# Patient Record
Sex: Male | Born: 1965 | Race: White | Hispanic: No | Smoking: Former smoker
Health system: Southern US, Community
[De-identification: ages and names within clinical notes are randomized; demographics above are authoritative.]

## PROBLEM LIST (undated history)

## (undated) DIAGNOSIS — I517 Cardiomegaly: Secondary | ICD-10-CM

## (undated) DIAGNOSIS — I10 Essential (primary) hypertension: Secondary | ICD-10-CM

## (undated) DIAGNOSIS — N289 Disorder of kidney and ureter, unspecified: Secondary | ICD-10-CM

## (undated) HISTORY — PX: NECK SURGERY: SHX720

## (undated) HISTORY — PX: DG SHOULDER ARTHROGRAM RIGHT: HXRAD1608

## (undated) HISTORY — PX: KNEE SURGERY: SHX244

## (undated) HISTORY — DX: Cardiomegaly: I51.7

## (undated) HISTORY — PX: HERNIA REPAIR: SHX51

---

## 1994-04-11 HISTORY — PX: OTHER SURGICAL HISTORY: SHX169

## 1997-12-23 ENCOUNTER — Inpatient Hospital Stay (HOSPITAL_COMMUNITY): Admission: EM | Admit: 1997-12-23 | Discharge: 1997-12-29 | Payer: Self-pay | Admitting: Emergency Medicine

## 1997-12-27 ENCOUNTER — Encounter: Payer: Self-pay | Admitting: General Surgery

## 2001-06-03 ENCOUNTER — Emergency Department (HOSPITAL_COMMUNITY): Admission: EM | Admit: 2001-06-03 | Discharge: 2001-06-04 | Payer: Self-pay | Admitting: *Deleted

## 2001-06-05 ENCOUNTER — Emergency Department (HOSPITAL_COMMUNITY): Admission: EM | Admit: 2001-06-05 | Discharge: 2001-06-05 | Payer: Self-pay

## 2002-03-22 ENCOUNTER — Ambulatory Visit (HOSPITAL_BASED_OUTPATIENT_CLINIC_OR_DEPARTMENT_OTHER): Admission: RE | Admit: 2002-03-22 | Discharge: 2002-03-22 | Payer: Self-pay | Admitting: General Surgery

## 2002-07-04 ENCOUNTER — Encounter: Admission: RE | Admit: 2002-07-04 | Discharge: 2002-07-17 | Payer: Self-pay | Admitting: General Surgery

## 2005-08-10 ENCOUNTER — Encounter: Payer: Self-pay | Admitting: Emergency Medicine

## 2005-08-10 ENCOUNTER — Encounter: Payer: Self-pay | Admitting: General Surgery

## 2015-09-29 ENCOUNTER — Ambulatory Visit: Payer: Self-pay | Admitting: Physician Assistant

## 2015-09-29 ENCOUNTER — Encounter (HOSPITAL_COMMUNITY): Payer: Self-pay | Admitting: Emergency Medicine

## 2015-09-29 ENCOUNTER — Encounter: Payer: Self-pay | Admitting: Physician Assistant

## 2015-09-29 ENCOUNTER — Emergency Department (HOSPITAL_COMMUNITY)
Admission: EM | Admit: 2015-09-29 | Discharge: 2015-09-29 | Disposition: A | Discharge status: Home / Self Care | Payer: Self-pay | Attending: Emergency Medicine | Admitting: Emergency Medicine

## 2015-09-29 VITALS — BP 108/84 | HR 65 | Temp 97.3°F | Ht 68.5 in | Wt 212.0 lb

## 2015-09-29 DIAGNOSIS — Z13220 Encounter for screening for lipoid disorders: Secondary | ICD-10-CM

## 2015-09-29 DIAGNOSIS — R51 Headache: Secondary | ICD-10-CM | POA: Insufficient documentation

## 2015-09-29 DIAGNOSIS — M542 Cervicalgia: Secondary | ICD-10-CM

## 2015-09-29 DIAGNOSIS — Z139 Encounter for screening, unspecified: Secondary | ICD-10-CM

## 2015-09-29 DIAGNOSIS — Z131 Encounter for screening for diabetes mellitus: Secondary | ICD-10-CM

## 2015-09-29 DIAGNOSIS — F17210 Nicotine dependence, cigarettes, uncomplicated: Secondary | ICD-10-CM | POA: Insufficient documentation

## 2015-09-29 DIAGNOSIS — I10 Essential (primary) hypertension: Secondary | ICD-10-CM | POA: Insufficient documentation

## 2015-09-29 DIAGNOSIS — Z79899 Other long term (current) drug therapy: Secondary | ICD-10-CM | POA: Insufficient documentation

## 2015-09-29 DIAGNOSIS — R519 Headache, unspecified: Secondary | ICD-10-CM

## 2015-09-29 DIAGNOSIS — E669 Obesity, unspecified: Secondary | ICD-10-CM | POA: Insufficient documentation

## 2015-09-29 HISTORY — DX: Essential (primary) hypertension: I10

## 2015-09-29 HISTORY — DX: Disorder of kidney and ureter, unspecified: N28.9

## 2015-09-29 LAB — GLUCOSE, POCT (MANUAL RESULT ENTRY): POC GLUCOSE: 113 mg/dL — AB (ref 70–99)

## 2015-09-29 MED ORDER — HYDROCHLOROTHIAZIDE 25 MG PO TABS
25.0000 mg | ORAL_TABLET | Freq: Every day | ORAL | Status: DC
  Administered 2015-09-29: 25 mg via ORAL
  Filled 2015-09-29: qty 1

## 2015-09-29 MED ORDER — HYDROCHLOROTHIAZIDE 25 MG PO TABS: 25.0000 mg | ORAL_TABLET | Freq: Every day | ORAL | Status: DC

## 2015-09-29 MED ORDER — BUPROPION HCL ER (XL) 300 MG PO TB24: 300.0000 mg | ORAL_TABLET | Freq: Every day | ORAL | Status: DC

## 2015-09-29 MED ORDER — CARVEDILOL 12.5 MG PO TABS
25.0000 mg | ORAL_TABLET | Freq: Two times a day (BID) | ORAL | Status: DC
  Administered 2015-09-29: 25 mg via ORAL
  Filled 2015-09-29: qty 2

## 2015-09-29 MED ORDER — LISINOPRIL 20 MG PO TABS: 40.0000 mg | ORAL_TABLET | Freq: Every day | ORAL | Status: DC

## 2015-09-29 MED ORDER — LISINOPRIL 10 MG PO TABS
40.0000 mg | ORAL_TABLET | Freq: Every day | ORAL | Status: DC
  Administered 2015-09-29: 40 mg via ORAL
  Filled 2015-09-29: qty 4

## 2015-09-29 MED ORDER — ACETAMINOPHEN 325 MG PO TABS
650.0000 mg | ORAL_TABLET | Freq: Once | ORAL | Status: AC
  Administered 2015-09-29: 650 mg via ORAL
  Filled 2015-09-29: qty 2

## 2015-09-29 MED ORDER — LISINOPRIL 40 MG PO TABS: 40.0000 mg | ORAL_TABLET | Freq: Every day | ORAL | Status: DC

## 2015-09-29 MED ORDER — CARVEDILOL 25 MG PO TABS: 25.0000 mg | ORAL_TABLET | Freq: Two times a day (BID) | ORAL | Status: DC

## 2015-09-29 MED ORDER — HYDROCODONE-ACETAMINOPHEN 5-325 MG PO TABS
1.0000 | ORAL_TABLET | Freq: Once | ORAL | Status: AC
  Administered 2015-09-29: 1 via ORAL
  Filled 2015-09-29: qty 1

## 2015-09-29 NOTE — Discharge Instructions (Signed)
Hypertension Hypertension, commonly called high blood pressure, is when the force of blood pumping through your arteries is too strong. Your arteries are the blood vessels that carry blood from your heart throughout your body. A blood pressure reading consists of a higher number over a lower number, such as 110/72. The higher number (systolic) is the pressure inside your arteries when your heart pumps. The lower number (diastolic) is the pressure inside your arteries when your heart relaxes. Ideally you want your blood pressure below 120/80. Hypertension forces your heart to work harder to pump blood. Your arteries may become narrow or stiff. Having untreated or uncontrolled hypertension can cause heart attack, stroke, kidney disease, and other problems. RISK FACTORS Some risk factors for high blood pressure are controllable. Others are not.  Risk factors you cannot control include:   Race. You may be at higher risk if you are African American.  Age. Risk increases with age.  Gender. Men are at higher risk than women before age 45 years. After age 65, women are at higher risk than men. Risk factors you can control include:  Not getting enough exercise or physical activity.  Being overweight.  Getting too much fat, sugar, calories, or salt in your diet.  Drinking too much alcohol. SIGNS AND SYMPTOMS Hypertension does not usually cause signs or symptoms. Extremely high blood pressure (hypertensive crisis) may cause headache, anxiety, shortness of breath, and nosebleed. DIAGNOSIS To check if you have hypertension, your health care provider will measure your blood pressure while you are seated, with your arm held at the level of your heart. It should be measured at least twice using the same arm. Certain conditions can cause a difference in blood pressure between your right and left arms. A blood pressure reading that is higher than normal on one occasion does not mean that you need treatment. If  it is not clear whether you have high blood pressure, you may be asked to return on a different day to have your blood pressure checked again. Or, you may be asked to monitor your blood pressure at home for 1 or more weeks. TREATMENT Treating high blood pressure includes making lifestyle changes and possibly taking medicine. Living a healthy lifestyle can help lower high blood pressure. You may need to change some of your habits. Lifestyle changes may include:  Following the DASH diet. This diet is high in fruits, vegetables, and whole grains. It is low in salt, red meat, and added sugars.  Keep your sodium intake below 2,300 mg per day.  Getting at least 30-45 minutes of aerobic exercise at least 4 times per week.  Losing weight if necessary.  Not smoking.  Limiting alcoholic beverages.  Learning ways to reduce stress. Your health care provider may prescribe medicine if lifestyle changes are not enough to get your blood pressure under control, and if one of the following is true:  You are 18-59 years of age and your systolic blood pressure is above 140.  You are 60 years of age or older, and your systolic blood pressure is above 150.  Your diastolic blood pressure is above 90.  You have diabetes, and your systolic blood pressure is over 140 or your diastolic blood pressure is over 90.  You have kidney disease and your blood pressure is above 140/90.  You have heart disease and your blood pressure is above 140/90. Your personal target blood pressure may vary depending on your medical conditions, your age, and other factors. HOME CARE INSTRUCTIONS    Have your blood pressure rechecked as directed by your health care provider.   Take medicines only as directed by your health care provider. Follow the directions carefully. Blood pressure medicines must be taken as prescribed. The medicine does not work as well when you skip doses. Skipping doses also puts you at risk for  problems.  Do not smoke.   Monitor your blood pressure at home as directed by your health care provider. SEEK MEDICAL CARE IF:   You think you are having a reaction to medicines taken.  You have recurrent headaches or feel dizzy.  You have swelling in your ankles.  You have trouble with your vision. SEEK IMMEDIATE MEDICAL CARE IF:  You develop a severe headache or confusion.  You have unusual weakness, numbness, or feel faint.  You have severe chest or abdominal pain.  You vomit repeatedly.  You have trouble breathing. MAKE SURE YOU:   Understand these instructions.  Will watch your condition.  Will get help right away if you are not doing well or get worse.   This information is not intended to replace advice given to you by your health care provider. Make sure you discuss any questions you have with your health care provider.   Document Released: 03/28/2005 Document Revised: 08/12/2014 Document Reviewed: 01/18/2013 Elsevier Interactive Patient Education 2016 Elsevier Inc. DASH Eating Plan DASH stands for "Dietary Approaches to Stop Hypertension." The DASH eating plan is a healthy eating plan that has been shown to reduce high blood pressure (hypertension). Additional health benefits may include reducing the risk of type 2 diabetes mellitus, heart disease, and stroke. The DASH eating plan may also help with weight loss. WHAT DO I NEED TO KNOW ABOUT THE DASH EATING PLAN? For the DASH eating plan, you will follow these general guidelines:  Choose foods with a percent daily value for sodium of less than 5% (as listed on the food label).  Use salt-free seasonings or herbs instead of table salt or sea salt.  Check with your health care provider or pharmacist before using salt substitutes.  Eat lower-sodium products, often labeled as "lower sodium" or "no salt added."  Eat fresh foods.  Eat more vegetables, fruits, and low-fat dairy products.  Choose whole grains.  Look for the word "whole" as the first word in the ingredient list.  Choose fish and skinless chicken or turkey more often than red meat. Limit fish, poultry, and meat to 6 oz (170 g) each day.  Limit sweets, desserts, sugars, and sugary drinks.  Choose heart-healthy fats.  Limit cheese to 1 oz (28 g) per day.  Eat more home-cooked food and less restaurant, buffet, and fast food.  Limit fried foods.  Cook foods using methods other than frying.  Limit canned vegetables. If you do use them, rinse them well to decrease the sodium.  When eating at a restaurant, ask that your food be prepared with less salt, or no salt if possible. WHAT FOODS CAN I EAT? Seek help from a dietitian for individual calorie needs. Grains Whole grain or whole wheat bread. Brown rice. Whole grain or whole wheat pasta. Quinoa, bulgur, and whole grain cereals. Low-sodium cereals. Corn or whole wheat flour tortillas. Whole grain cornbread. Whole grain crackers. Low-sodium crackers. Vegetables Fresh or frozen vegetables (raw, steamed, roasted, or grilled). Low-sodium or reduced-sodium tomato and vegetable juices. Low-sodium or reduced-sodium tomato sauce and paste. Low-sodium or reduced-sodium canned vegetables.  Fruits All fresh, canned (in natural juice), or frozen fruits. Meat and Other   Protein Products Ground beef (85% or leaner), grass-fed beef, or beef trimmed of fat. Skinless chicken or turkey. Ground chicken or turkey. Pork trimmed of fat. All fish and seafood. Eggs. Dried beans, peas, or lentils. Unsalted nuts and seeds. Unsalted canned beans. Dairy Low-fat dairy products, such as skim or 1% milk, 2% or reduced-fat cheeses, low-fat ricotta or cottage cheese, or plain low-fat yogurt. Low-sodium or reduced-sodium cheeses. Fats and Oils Tub margarines without trans fats. Light or reduced-fat mayonnaise and salad dressings (reduced sodium). Avocado. Safflower, olive, or canola oils. Natural peanut or almond  butter. Other Unsalted popcorn and pretzels. The items listed above may not be a complete list of recommended foods or beverages. Contact your dietitian for more options. WHAT FOODS ARE NOT RECOMMENDED? Grains White bread. White pasta. White rice. Refined cornbread. Bagels and croissants. Crackers that contain trans fat. Vegetables Creamed or fried vegetables. Vegetables in a cheese sauce. Regular canned vegetables. Regular canned tomato sauce and paste. Regular tomato and vegetable juices. Fruits Dried fruits. Canned fruit in light or heavy syrup. Fruit juice. Meat and Other Protein Products Fatty cuts of meat. Ribs, chicken wings, bacon, sausage, bologna, salami, chitterlings, fatback, hot dogs, bratwurst, and packaged luncheon meats. Salted nuts and seeds. Canned beans with salt. Dairy Whole or 2% milk, cream, half-and-half, and cream cheese. Whole-fat or sweetened yogurt. Full-fat cheeses or blue cheese. Nondairy creamers and whipped toppings. Processed cheese, cheese spreads, or cheese curds. Condiments Onion and garlic salt, seasoned salt, table salt, and sea salt. Canned and packaged gravies. Worcestershire sauce. Tartar sauce. Barbecue sauce. Teriyaki sauce. Soy sauce, including reduced sodium. Steak sauce. Fish sauce. Oyster sauce. Cocktail sauce. Horseradish. Ketchup and mustard. Meat flavorings and tenderizers. Bouillon cubes. Hot sauce. Tabasco sauce. Marinades. Taco seasonings. Relishes. Fats and Oils Butter, stick margarine, lard, shortening, ghee, and bacon fat. Coconut, palm kernel, or palm oils. Regular salad dressings. Other Pickles and olives. Salted popcorn and pretzels. The items listed above may not be a complete list of foods and beverages to avoid. Contact your dietitian for more information. WHERE CAN I FIND MORE INFORMATION? National Heart, Lung, and Blood Institute: www.nhlbi.nih.gov/health/health-topics/topics/dash/   This information is not intended to replace  advice given to you by your health care provider. Make sure you discuss any questions you have with your health care provider.   Document Released: 03/17/2011 Document Revised: 04/18/2014 Document Reviewed: 01/30/2013 Elsevier Interactive Patient Education 2016 Elsevier Inc.  

## 2015-09-29 NOTE — ED Notes (Addendum)
PT states his blood pressure medication has been out since this past January and states had his blood pressure checked this am at [facility] and was told to come to ED. PT denies any CP or SOB. PT states he [id number].

## 2015-09-29 NOTE — ED Notes (Signed)
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 MD made aware.

## 2015-09-29 NOTE — Patient Instructions (Addendum)
Carvedilol 25mg  1 tablet twice daily Lisinopril 20mg  2 tablets once daily Hydrochlorothiazide 25mg  1 tablet daily  Get fasting labs drawn tomorrow morning
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
## 2015-09-29 NOTE — Progress Notes (Signed)
BP 150/112 mmHg  Pulse 76  Temp(Src) 97.9 F (36.6 C)  Ht 5' 8.5" (1.74 m)  Wt 212 lb (96.163 kg)  BMI 31.76 kg/m2  SpO2 98%   Subjective:    Patient ID: Robert Harding, male    DOB: 1965-12-06, 50 y.o.   MRN: [id number]  HPI: Robert Harding is a 50 y.o. male presenting on 09/29/2015 for New Patient (Initial Visit) and Hypertension   HPI   Chief Complaint  Patient presents with  . New Patient (Initial Visit)    pt moved from [place] in Jan. last PCP he has was seen there.  . Hypertension    pt arrived here after being at [facility]. pt belives it is mostly due to neck pain and not being able to sleep because of the pain     Went to ER this a.m.  He was given rx for coreg  bid, wellbutrin xl  qd, hctz  qd, lisinopril  qd  Pt is feeling well.  He is mostly wanting to get rid of HA and neckache.  He was supposed to have cervical fusion but they didn't do it b/c his bp went up in middle of surgery. instead they put a plate in it.  He says now it feels worse than beofre.  The surgery was in dec 2016 in [place] before he moved in jan 2017 to [place].   During conversation, pt began "feeling weird". Became clammy. Denied CP, HA, vision changes.  Pt moved to exam table.  BP low at 108/84.  Pt fanned and after about 10 minutes he said he felt well again. His skin dried up.  EKG done while he "felt weird" was normal.  Pt denies depression- he says he doesn't need wellbrutrin. Says was just depressed in the past due to the pain and bp.   Relevant past medical, surgical, family and social history reviewed and updated as indicated. Interim medical history since our last visit reviewed. Allergies and medications reviewed and updated.  CURRENT MEDS: none  Review of Systems  Constitutional: Negative for fever, chills, diaphoresis, appetite change, fatigue and unexpected weight change.  HENT: Negative for congestion, dental problem, drooling, ear pain, facial swelling,  hearing loss, mouth sores, sneezing, sore throat, trouble swallowing and voice change.   Eyes: Negative for pain, discharge, redness, itching and visual disturbance.  Respiratory: Negative for cough, choking, shortness of breath and wheezing.   Cardiovascular: Negative for chest pain, palpitations and leg swelling.  Gastrointestinal: Negative for vomiting, abdominal pain, diarrhea, constipation and blood in stool.  Endocrine: Negative for cold intolerance, heat intolerance and polydipsia.  Genitourinary: Negative for dysuria, hematuria and decreased urine volume.  Musculoskeletal: Negative for back pain, arthralgias and gait problem.  Skin: Negative for rash.  Allergic/Immunologic: Negative for environmental allergies.  Neurological: Negative for seizures, syncope, light-headedness and headaches.  Hematological: Negative for adenopathy.  Psychiatric/Behavioral: Negative for suicidal ideas, dysphoric mood and agitation. The patient is not nervous/anxious.     Per HPI unless specifically indicated above     Objective:    BP 150/112 mmHg  Pulse 76  Temp(Src) 97.9 F (36.6 C)  Ht 5' 8.5" (1.74 m)  Wt 212 lb (96.163 kg)  BMI 31.76 kg/m2  SpO2 98%  Wt Readings from Last 3 Encounters:  09/29/15 212 lb (96.163 kg)  09/29/15 215 lb (97.523 kg)    Physical Exam  Constitutional: He is oriented to person, place, and time. He appears well-developed and well-nourished.  HENT:  Head:  Normocephalic and atraumatic.  Mouth/Throat: Oropharynx is clear and moist. No oropharyngeal exudate.  Eyes: Conjunctivae and EOM are normal. Pupils are equal, round, and reactive to light.  Neck: Neck supple. No thyromegaly present.  Cardiovascular: Normal rate and regular rhythm.   Pulmonary/Chest: Effort normal and breath sounds normal. He has no wheezes. He has no rales.  Abdominal: Soft. Bowel sounds are normal. He exhibits no mass. There is no hepatosplenomegaly. There is no tenderness.  Musculoskeletal:  He exhibits no edema.  Lymphadenopathy:    He has no cervical adenopathy.  Neurological: He is alert and oriented to person, place, and time.  Skin: Skin is warm and dry. No rash noted.  Psychiatric: He has a normal mood and affect. His behavior is normal. Thought content normal.  Vitals reviewed.   Results for orders placed or performed in visit on 09/29/15  POCT Glucose (CBG)  Result Value Ref Range   POC Glucose 113 (A) 70 - 99 mg/dl      Assessment & Plan:   Encounter Diagnoses  Name Primary?  . Essential hypertension, benign Yes  . Neck pain   . Cigarette nicotine dependence without complication   . Obesity, unspecified   . Screening for diabetes mellitus   . Screening cholesterol level      -discussed with pt that his episode today was likely due to medications given in ER to get his BP down.  He is told to not start his Rx medications until tomorrow.  He says he feels well and is in agreement with plan. -due to costs of medications, will Replaced lisinopril 40mg  rx given in ER with lisinopril 20mg  take 2 tabs daily -Will get signed up for medassist to order meds for pt -Requested records on neck surgery for hospital in [place] -pt to get fasting Labs drawn tomorrow morning -pt was given [facility] application which will help with his visit to ER today as well as future imaging/referrals for neck if needed -F/u 1 month.  RTO sooner prn  Pt given list of his medications:  Carvedilol 25mg  1 tablet twice daily Lisinopril 20mg  2 tablets once daily Hydrochlorothiazide 25mg  1 tablet daily
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
## 2015-09-30 LAB — COMPLETE METABOLIC PANEL WITH GFR
ALT: 34 U/L (ref 9–46)
AST: 22 U/L (ref 10–40)
Albumin: 4.5 g/dL (ref 3.6–5.1)
Alkaline Phosphatase: 55 U/L (ref 40–115)
BUN: 23 mg/dL (ref 7–25)
CHLORIDE: 102 mmol/L (ref 98–110)
CO2: 28 mmol/L (ref 20–31)
Calcium: 10.1 mg/dL (ref 8.6–10.3)
Creat: 1.26 mg/dL (ref 0.60–1.35)
GFR, EST AFRICAN AMERICAN: 77 mL/min (ref >=60)
GFR, EST NON AFRICAN AMERICAN: 67 mL/min (ref >=60)
GLUCOSE: 93 mg/dL (ref 65–99)
POTASSIUM: 4.4 mmol/L (ref 3.5–5.3)
SODIUM: 141 mmol/L (ref 135–146)
Total Bilirubin: 0.5 mg/dL (ref 0.2–1.2)
Total Protein: 7.2 g/dL (ref 6.1–8.1)

## 2015-09-30 LAB — CBC WITH DIFFERENTIAL/PLATELET
BASOS PCT: 0 %
Basophils Absolute: 0 cells/uL (ref 0–200)
EOS PCT: 3 %
Eosinophils Absolute: 216 cells/uL (ref 15–500)
HCT: 44.4 % (ref 38.5–50.0)
HEMOGLOBIN: 15.2 g/dL (ref 13.2–17.1)
LYMPHS ABS: 2520 {cells}/uL (ref 850–3900)
Lymphocytes Relative: 35 %
MCH: 31.8 pg (ref 27.0–33.0)
MCHC: 34.2 g/dL (ref 32.0–36.0)
MCV: 92.9 fL (ref 80.0–100.0)
MONO ABS: 720 {cells}/uL (ref 200–950)
MPV: 10.9 fL (ref 7.5–12.5)
Monocytes Relative: 10 %
NEUTROS ABS: 3744 {cells}/uL (ref 1500–7800)
NEUTROS PCT: 52 %
Platelets: 321 10*3/uL (ref 140–400)
RBC: 4.78 MIL/uL (ref 4.20–5.80)
RDW: 13.5 % (ref 11.0–15.0)
WBC: 7.2 10*3/uL (ref 3.8–10.8)

## 2015-09-30 LAB — LIPID PANEL
Cholesterol: 233 mg/dL — ABNORMAL HIGH (ref 125–200)
HDL: 40 mg/dL (ref >=40)
LDL CALC: 119 mg/dL (ref <130)
TRIGLYCERIDES: 368 mg/dL — AB (ref <150)
Total CHOL/HDL Ratio: 5.8 Ratio — ABNORMAL HIGH (ref <=5.0)
VLDL: 74 mg/dL — AB (ref <30)

## 2015-10-01 LAB — HEMOGLOBIN A1C
Hgb A1c MFr Bld: 5.2 % (ref <5.7)
Mean Plasma Glucose: 103 mg/dL

## 2015-10-10 ENCOUNTER — Emergency Department (HOSPITAL_COMMUNITY)
Admission: EM | Admit: 2015-10-10 | Discharge: 2015-10-10 | Disposition: A | Discharge status: Home / Self Care | Payer: Self-pay | Attending: Emergency Medicine | Admitting: Emergency Medicine

## 2015-10-10 ENCOUNTER — Encounter (HOSPITAL_COMMUNITY): Payer: Self-pay | Admitting: Emergency Medicine

## 2015-10-10 ENCOUNTER — Emergency Department (HOSPITAL_COMMUNITY): Payer: Self-pay

## 2015-10-10 DIAGNOSIS — I10 Essential (primary) hypertension: Secondary | ICD-10-CM | POA: Insufficient documentation

## 2015-10-10 DIAGNOSIS — M5441 Lumbago with sciatica, right side: Secondary | ICD-10-CM | POA: Insufficient documentation

## 2015-10-10 DIAGNOSIS — F17210 Nicotine dependence, cigarettes, uncomplicated: Secondary | ICD-10-CM | POA: Insufficient documentation

## 2015-10-10 DIAGNOSIS — M545 Low back pain: Secondary | ICD-10-CM | POA: Insufficient documentation

## 2015-10-10 MED ORDER — DEXAMETHASONE SODIUM PHOSPHATE 4 MG/ML IJ SOLN
8.0000 mg | Freq: Once | INTRAMUSCULAR | Status: AC
Start: 2015-10-10 — End: 2015-10-10
  Administered 2015-10-10: 8 mg via INTRAMUSCULAR
  Filled 2015-10-10: qty 2

## 2015-10-10 MED ORDER — MORPHINE SULFATE (PF) 4 MG/ML IV SOLN
8.0000 mg | Freq: Once | INTRAVENOUS | Status: AC
  Administered 2015-10-10: 8 mg via INTRAMUSCULAR
  Filled 2015-10-10: qty 2

## 2015-10-10 MED ORDER — DIAZEPAM 5 MG PO TABS
10.0000 mg | ORAL_TABLET | Freq: Once | ORAL | Status: AC
  Administered 2015-10-10: 10 mg via ORAL
  Filled 2015-10-10: qty 2

## 2015-10-10 MED ORDER — TRAMADOL HCL 50 MG PO TABS: 50.0000 mg | ORAL_TABLET | Freq: Four times a day (QID) | ORAL | Status: DC | PRN

## 2015-10-10 MED ORDER — BACLOFEN 10 MG PO TABS: 10.0000 mg | ORAL_TABLET | Freq: Three times a day (TID) | ORAL | Status: AC

## 2015-10-10 NOTE — ED Notes (Signed)
Pt c/o right lower back and hip pain radiating down right leg. denies injury. nad noted.

## 2015-10-10 NOTE — Discharge Instructions (Signed)
Your blood pressure is elevated, but your vital signs are otherwise within normal limits. No gross neurologic deficits appreciated on at this time. Review of your x-rays show no changes of the sacroiliac joints and pelvis, or the right hip joint. It is important that you see the orthopedic specialist concerning this. Please use the baclofen and Ultram prescribed earlier this morning for your discomfort. He is return for any emergent changes, problems or concerns. Back Pain, Adult Back pain is very common. The pain often gets better over time. The cause of back pain is usually not dangerous. Most people can learn to manage their back pain on their own.  HOME CARE  Watch your back pain for any changes. The following actions may help to lessen any pain you are feeling:  Stay active. Start with short walks on flat ground if you can. Try to walk farther each day.  Exercise regularly as told by your doctor. Exercise helps your back heal faster. It also helps avoid future injury by keeping your muscles strong and flexible.  Do not sit, drive, or stand in one place for more than 30 minutes.  Do not stay in bed. Resting more than 1-2 days can slow down your recovery.  Be careful when you bend or lift an object. Use good form when lifting:  Bend at your knees.  Keep the object close to your body.  Do not twist.  Sleep on a firm mattress. Lie on your side, and bend your knees. If you lie on your back, put a pillow under your knees.  Take medicines only as told by your doctor.  Put ice on the injured area.  Put ice in a plastic bag.  Place a towel between your skin and the bag.  Leave the ice on for 20 minutes, 2-3 times a day for the first 2-3 days. After that, you can switch between ice and heat packs.  Avoid feeling anxious or stressed. Find good ways to deal with stress, such as exercise.  Maintain a healthy weight. Extra weight puts stress on your back. GET HELP IF:   You have pain  that does not go away with rest or medicine.  You have worsening pain that goes down into your legs or buttocks.  You have pain that does not get better in one week.  You have pain at night.  You lose weight.  You have a fever or chills. GET HELP RIGHT AWAY IF:   You cannot control when you poop (bowel movement) or pee (urinate).  Your arms or legs feel weak.  Your arms or legs lose feeling (numbness).  You feel sick to your stomach (nauseous) or throw up (vomit).  You have belly (abdominal) pain.  You feel like you may pass out (faint).   This information is not intended to replace advice given to you by your health care provider. Make sure you discuss any questions you have with your health care provider.   Document Released: 09/14/2007 Document Revised: 04/18/2014 Document Reviewed: 07/30/2013 Elsevier Interactive Patient Education [employer]2016 Elsevier Inc.

## 2015-10-10 NOTE — Discharge Instructions (Signed)
Your x-rays are negative for acute fracture or dislocation. There no gross neurologic deficits appreciated at this time. Please see Dr. Magnus IvanBlackman, or the orthopedic specialist of your choice for additional evaluation of your back and hip area pain. Use baclofen 3 times daily, use Ultram every 6 hours. This medication may cause drowsiness, please use with caution.
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
## 2015-10-10 NOTE — ED Notes (Signed)
Pt states he returns for increased right leg pain after getting out to the waiting area.  Pt states he never left the ER.

## 2015-10-10 NOTE — ED Notes (Signed)
Patient verbalizes understanding of discharge instructions, prescriptions, home care and follow up care. Patient out of department at this time. 
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
## 2015-10-14 ENCOUNTER — Ambulatory Visit: Payer: Self-pay | Admitting: Physician Assistant

## 2015-10-15 ENCOUNTER — Encounter: Payer: Self-pay | Admitting: Physician Assistant

## 2015-10-15 ENCOUNTER — Ambulatory Visit: Payer: Self-pay | Admitting: Physician Assistant

## 2015-10-15 ENCOUNTER — Encounter (HOSPITAL_COMMUNITY): Payer: Self-pay | Admitting: Emergency Medicine

## 2015-10-15 ENCOUNTER — Ambulatory Visit (HOSPITAL_COMMUNITY): Payer: Self-pay

## 2015-10-15 ENCOUNTER — Emergency Department (HOSPITAL_COMMUNITY)
Admission: EM | Admit: 2015-10-15 | Discharge: 2015-10-15 | Disposition: A | Discharge status: Home / Self Care | Payer: Self-pay | Attending: Emergency Medicine | Admitting: Emergency Medicine

## 2015-10-15 ENCOUNTER — Emergency Department (HOSPITAL_COMMUNITY): Payer: Self-pay

## 2015-10-15 VITALS — BP 114/86 | HR 79 | Temp 97.9°F | Ht 68.5 in | Wt 209.4 lb

## 2015-10-15 DIAGNOSIS — R197 Diarrhea, unspecified: Secondary | ICD-10-CM | POA: Insufficient documentation

## 2015-10-15 DIAGNOSIS — R1031 Right lower quadrant pain: Secondary | ICD-10-CM | POA: Insufficient documentation

## 2015-10-15 DIAGNOSIS — F17210 Nicotine dependence, cigarettes, uncomplicated: Secondary | ICD-10-CM | POA: Insufficient documentation

## 2015-10-15 DIAGNOSIS — R109 Unspecified abdominal pain: Secondary | ICD-10-CM

## 2015-10-15 DIAGNOSIS — M545 Low back pain, unspecified: Secondary | ICD-10-CM

## 2015-10-15 DIAGNOSIS — Z79899 Other long term (current) drug therapy: Secondary | ICD-10-CM | POA: Insufficient documentation

## 2015-10-15 DIAGNOSIS — R112 Nausea with vomiting, unspecified: Secondary | ICD-10-CM | POA: Insufficient documentation

## 2015-10-15 DIAGNOSIS — I10 Essential (primary) hypertension: Secondary | ICD-10-CM | POA: Insufficient documentation

## 2015-10-15 DIAGNOSIS — M25551 Pain in right hip: Secondary | ICD-10-CM

## 2015-10-15 LAB — COMPREHENSIVE METABOLIC PANEL
ALK PHOS: 59 U/L (ref 38–126)
ALT: 42 U/L (ref 17–63)
AST: 26 U/L (ref 15–41)
Albumin: 4.6 g/dL (ref 3.5–5.0)
Anion gap: 9 (ref 5–15)
BILIRUBIN TOTAL: 0.4 mg/dL (ref 0.3–1.2)
BUN: 36 mg/dL — ABNORMAL HIGH (ref 6–20)
CALCIUM: 9.5 mg/dL (ref 8.9–10.3)
CO2: 29 mmol/L (ref 22–32)
CREATININE: 1.22 mg/dL (ref 0.61–1.24)
Chloride: 98 mmol/L — ABNORMAL LOW (ref 101–111)
Glucose, Bld: 140 mg/dL — ABNORMAL HIGH (ref 65–99)
Potassium: 3.7 mmol/L (ref 3.5–5.1)
Sodium: 136 mmol/L (ref 135–145)
Total Protein: 7.8 g/dL (ref 6.5–8.1)

## 2015-10-15 LAB — URINE MICROSCOPIC-ADD ON
BACTERIA UA: NONE SEEN
SQUAMOUS EPITHELIAL / LPF: NONE SEEN
WBC, UA: NONE SEEN WBC/hpf (ref 0–5)

## 2015-10-15 LAB — CBC
HCT: 47.3 % (ref 39.0–52.0)
Hemoglobin: 16.9 g/dL (ref 13.0–17.0)
MCH: 32.3 pg (ref 26.0–34.0)
MCHC: 35.7 g/dL (ref 30.0–36.0)
MCV: 90.4 fL (ref 78.0–100.0)
PLATELETS: 305 10*3/uL (ref 150–400)
RBC: 5.23 MIL/uL (ref 4.22–5.81)
RDW: 11.9 % (ref 11.5–15.5)
WBC: 9.7 10*3/uL (ref 4.0–10.5)

## 2015-10-15 LAB — URINALYSIS, ROUTINE W REFLEX MICROSCOPIC
Bilirubin Urine: NEGATIVE
Glucose, UA: NEGATIVE mg/dL
KETONES UR: NEGATIVE mg/dL
LEUKOCYTES UA: NEGATIVE
Nitrite: NEGATIVE
PROTEIN: NEGATIVE mg/dL
Specific Gravity, Urine: 1.02 (ref 1.005–1.030)
pH: 6 (ref 5.0–8.0)

## 2015-10-15 LAB — LIPASE, BLOOD: Lipase: 24 U/L (ref 11–51)

## 2015-10-15 MED ORDER — HYDROCODONE-ACETAMINOPHEN 5-325 MG PO TABS: 1.0000 | ORAL_TABLET | Freq: Four times a day (QID) | ORAL | Status: DC | PRN

## 2015-10-15 MED ORDER — DIATRIZOATE MEGLUMINE & SODIUM 66-10 % PO SOLN
ORAL | Status: AC
  Filled 2015-10-15: qty 30

## 2015-10-15 MED ORDER — HYDROMORPHONE HCL 1 MG/ML IJ SOLN
1.0000 mg | Freq: Once | INTRAMUSCULAR | Status: AC
  Administered 2015-10-15: 1 mg via INTRAVENOUS
  Filled 2015-10-15: qty 1

## 2015-10-15 MED ORDER — ONDANSETRON HCL 4 MG/2ML IJ SOLN
4.0000 mg | Freq: Once | INTRAMUSCULAR | Status: AC
  Administered 2015-10-15: 4 mg via INTRAVENOUS
  Filled 2015-10-15: qty 2

## 2015-10-15 MED ORDER — IOPAMIDOL (ISOVUE-300) INJECTION 61%: 100.0000 mL | Freq: Once | INTRAVENOUS | Status: DC | PRN

## 2015-10-15 MED ORDER — NAPROXEN 500 MG PO TABS: 500.0000 mg | ORAL_TABLET | Freq: Two times a day (BID) | ORAL | Status: DC
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
## 2015-10-15 NOTE — Discharge Instructions (Signed)
Follow up with your provider next week

## 2015-10-15 NOTE — ED Notes (Signed)
[facility], was sent here to evaluate abdominal pain, vomiting and diarrhea  X 1 week

## 2015-10-15 NOTE — ED Notes (Signed)
Pt informed he has an outpatient CT scan ordered by pcp. Pt states he still wants to be seen in ED for pain control.

## 2015-10-15 NOTE — Progress Notes (Signed)
BP 114/86 mmHg  Pulse 79  Temp(Src) 97.9 F (36.6 C)  Ht 5' 8.5" (1.74 m)  Wt 209 lb 6.4 oz (94.983 kg)  BMI 31.37 kg/m2  SpO2 98%   Subjective:    Patient ID: Robert Harding, male    DOB: 06-16-1965, 50 y.o.   MRN: [id number]  HPI: Robert Harding is a 50 y.o. male presenting on 10/15/2015 for Follow-up   HPI   Chief Complaint  Patient presents with  . Follow-up    from ER     Pt states about 1 1/2 week ago he was just walking and his R hip popped.  He says he rested but pain became unbearable so he went to the ER.  xrays done.  Pt states morphine shots "knocked the edge off" but rx didn't help really.    No hx low back problems.  He had neck surgery, but not lower back issues.   Pt says he tried to turn in his CD but Caryl Pina was out.  Pt got his [facility]s from [facility].  Relevant past medical, surgical, family and social history reviewed and updated as indicated. Interim medical history since our last visit reviewed. Allergies and medications reviewed and updated.   Current outpatient prescriptions:  .  baclofen (LIORESAL) 10 MG tablet, Take 1 tablet (10 mg total) by mouth 3 (three) times daily., Disp: 21 each, Rfl: 0 .  buPROPion (WELLBUTRIN XL) 300 MG 24 hr tablet, Take 1 tablet (300 mg total) by mouth daily., Disp: 30 tablet, Rfl: 0 .  Calcium-Magnesium-Vitamin D (CALCIUM 500 PO), Take 1,000 mg by mouth daily. Reported on 09/29/2015, Disp: , Rfl:  .  carvedilol (COREG) 25 MG tablet, Take 1 tablet (25 mg total) by mouth 2 (two) times daily with a meal., Disp: 180 tablet, Rfl: 1 .  hydrochlorothiazide (HYDRODIURIL) 25 MG tablet, Take 1 tablet (25 mg total) by mouth daily., Disp: 90 tablet, Rfl: 1 .  hydrOXYzine (VISTARIL) 50 MG capsule, Take 50 mg by mouth at bedtime as needed (for sleep). Reported on 09/29/2015, Disp: , Rfl:  .  lisinopril (PRINIVIL,ZESTRIL) 20 MG tablet, Take 2 tablets (40 mg total) by mouth daily., Disp: 180 tablet, Rfl: 1 .  [DISCONTINUED] amitriptyline  (ELAVIL) 25 MG tablet, Take 25 mg by mouth at bedtime., Disp: , Rfl:  .  [DISCONTINUED] zolpidem (AMBIEN CR) 12.5 MG CR tablet, Take 12.5 mg by mouth at bedtime as needed for sleep., Disp: , Rfl:    Review of Systems  Constitutional: Negative for fever, chills, diaphoresis, appetite change, fatigue and unexpected weight change.  HENT: Negative for congestion, dental problem, drooling, ear pain, facial swelling, hearing loss, mouth sores, sneezing, sore throat, trouble swallowing and voice change.   Eyes: Negative for pain, discharge, redness, itching and visual disturbance.  Respiratory: Negative for cough, choking, shortness of breath and wheezing.   Cardiovascular: Negative for chest pain, palpitations and leg swelling.  Gastrointestinal: Negative for vomiting, abdominal pain, diarrhea, constipation and blood in stool.  Endocrine: Negative for cold intolerance, heat intolerance and polydipsia.  Genitourinary: Negative for dysuria, hematuria and decreased urine volume.  Musculoskeletal: Positive for back pain and gait problem. Negative for arthralgias.  Skin: Positive for rash.  Allergic/Immunologic: Negative for environmental allergies.  Neurological: Positive for headaches. Negative for seizures, syncope and light-headedness.  Hematological: Negative for adenopathy.  Psychiatric/Behavioral: Positive for dysphoric mood. Negative for suicidal ideas and agitation. The patient is nervous/anxious.     Per HPI unless specifically indicated above  Objective:    BP 114/86 mmHg  Pulse 79  Temp(Src) 97.9 F (36.6 C)  Ht 5' 8.5" (1.74 m)  Wt 209 lb 6.4 oz (94.983 kg)  BMI 31.37 kg/m2  SpO2 98%  Wt Readings from Last 3 Encounters:  10/15/15 209 lb 6.4 oz (94.983 kg)  10/10/15 210 lb (95.255 kg)  10/10/15 210 lb (95.255 kg)    Physical Exam  Constitutional: He is oriented to person, place, and time. He appears well-developed and well-nourished.  HENT:  Head: Normocephalic and  atraumatic.  Neck: Neck supple.  Cardiovascular: Normal rate and regular rhythm.   Pulmonary/Chest: Effort normal and breath sounds normal. He has no wheezes.  Abdominal: Soft. Bowel sounds are normal. He exhibits no distension and no mass. There is no hepatosplenomegaly. There is tenderness (very) in the right lower quadrant. There is no rigidity, no rebound and no CVA tenderness.  Musculoskeletal: He exhibits no edema.       Lumbar back: He exhibits tenderness. He exhibits normal range of motion, no bony tenderness, no swelling and no edema.       Back:  Very mild non-point tenderness R lumbar back.  Hips/pelvis stable and non-tender.  - SLR.   Lymphadenopathy:    He has no cervical adenopathy.  Neurological: He is alert and oriented to person, place, and time. Gait abnormal.  Walks with straight R leg to minimize his pain  Skin: Skin is warm and dry.  Psychiatric: He has a normal mood and affect. His behavior is normal.  Vitals reviewed.   Results for orders placed or performed in visit on 09/29/15  Lipid Profile  Result Value Ref Range   Cholesterol 233 (H) 125 - 200 mg/dL   Triglycerides 368 (H) <150 mg/dL   HDL 40 >=40 mg/dL   Total CHOL/HDL Ratio 5.8 (H) <=5.0 Ratio   VLDL 74 (H) <30 mg/dL   LDL Cholesterol 119 <130 mg/dL  COMPLETE METABOLIC PANEL WITH GFR  Result Value Ref Range   Sodium 141 135 - 146 mmol/L   Potassium 4.4 3.5 - 5.3 mmol/L   Chloride 102 98 - 110 mmol/L   CO2 28 20 - 31 mmol/L   Glucose, Bld 93 65 - 99 mg/dL   BUN 23 7 - 25 mg/dL   Creat 1.26 0.60 - 1.35 mg/dL   Total Bilirubin 0.5 0.2 - 1.2 mg/dL   Alkaline Phosphatase 55 40 - 115 U/L   AST 22 10 - 40 U/L   ALT 34 9 - 46 U/L   Total Protein 7.2 6.1 - 8.1 g/dL   Albumin 4.5 3.6 - 5.1 g/dL   Calcium 10.1 8.6 - 10.3 mg/dL   GFR, Est African American 77 >=60 mL/min   GFR, Est Non African American 67 >=60 mL/min  HgB A1c  Result Value Ref Range   Hgb A1c MFr Bld 5.2 <5.7 %   Mean Plasma Glucose  103 mg/dL  CBC w/Diff/Platelet  Result Value Ref Range   WBC 7.2 3.8 - 10.8 K/uL   RBC 4.78 4.20 - 5.80 MIL/uL   Hemoglobin 15.2 13.2 - 17.1 g/dL   HCT 44.4 38.5 - 50.0 %   MCV 92.9 80.0 - 100.0 fL   MCH 31.8 27.0 - 33.0 pg   MCHC 34.2 32.0 - 36.0 g/dL   RDW 13.5 11.0 - 15.0 %   Platelets 321 140 - 400 K/uL   MPV 10.9 7.5 - 12.5 fL   Neutro Abs 3744 1500 - 7800 cells/uL  Lymphs Abs 2520 850 - 3900 cells/uL   Monocytes Absolute 720 200 - 950 cells/uL   Eosinophils Absolute 216 15 - 500 cells/uL   Basophils Absolute 0 0 - 200 cells/uL   Neutrophils Relative % 52 %   Lymphocytes Relative 35 %   Monocytes Relative 10 %   Eosinophils Relative 3 %   Basophils Relative 0 %   Smear Review Criteria for review not met   POCT Glucose (CBG)  Result Value Ref Range   POC Glucose 113 (A) 70 - 99 mg/dl      Assessment & Plan:   Encounter Diagnoses  Name Primary?  . RLQ abdominal pain Yes  . Right hip pain     -pt reminded to Turn in [facility] discount application -Reviewed labs with pt -CT abd/pelvis now- worrisome for appendicitis or diverticulitis -F/u 7/17 as scheduled. Will address lipids and meds at that time

## 2015-10-15 NOTE — ED Notes (Signed)
Pt finished drinking po contrast. CT notified.   Pt family education given on why CT not performed at outpatient time of 1115 (ED exam per pt request for pain control, blood work results and po contrast).

## 2015-10-16 ENCOUNTER — Telehealth: Payer: Self-pay | Admitting: Physician Assistant

## 2015-10-16 DIAGNOSIS — M25551 Pain in right hip: Secondary | ICD-10-CM

## 2015-10-16 NOTE — Telephone Encounter (Signed)
Reviewed results CT with pt.  We will order referral to orthopedics.  Pt is reminded to turn in his [facility] discount application.  Will see pt in office 7/17 as scheduled.  He is to call to RTO sooner prn worsening or new symptoms.  He states understanding

## 2015-10-26 ENCOUNTER — Ambulatory Visit: Payer: Self-pay | Admitting: Physician Assistant

## 2015-10-26 ENCOUNTER — Encounter: Payer: Self-pay | Admitting: Physician Assistant

## 2015-10-26 VITALS — BP 100/74 | HR 74 | Temp 97.7°F | Ht 68.5 in | Wt 216.0 lb

## 2015-10-26 DIAGNOSIS — I10 Essential (primary) hypertension: Secondary | ICD-10-CM

## 2015-10-26 DIAGNOSIS — E785 Hyperlipidemia, unspecified: Secondary | ICD-10-CM | POA: Insufficient documentation

## 2015-10-26 DIAGNOSIS — F17210 Nicotine dependence, cigarettes, uncomplicated: Secondary | ICD-10-CM

## 2015-10-26 DIAGNOSIS — M25551 Pain in right hip: Secondary | ICD-10-CM

## 2015-10-26 DIAGNOSIS — R911 Solitary pulmonary nodule: Secondary | ICD-10-CM | POA: Insufficient documentation

## 2015-10-26 DIAGNOSIS — E669 Obesity, unspecified: Secondary | ICD-10-CM
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
## 2015-12-01 ENCOUNTER — Encounter: Payer: Self-pay | Admitting: Physician Assistant

## 2015-12-01 ENCOUNTER — Ambulatory Visit: Payer: Self-pay | Admitting: Physician Assistant

## 2015-12-01 VITALS — BP 120/80 | HR 77 | Temp 98.1°F | Ht 68.5 in | Wt 223.2 lb

## 2015-12-01 DIAGNOSIS — F17210 Nicotine dependence, cigarettes, uncomplicated: Secondary | ICD-10-CM

## 2015-12-01 DIAGNOSIS — E669 Obesity, unspecified: Secondary | ICD-10-CM

## 2015-12-01 DIAGNOSIS — R079 Chest pain, unspecified: Secondary | ICD-10-CM

## 2015-12-01 DIAGNOSIS — E785 Hyperlipidemia, unspecified: Secondary | ICD-10-CM

## 2015-12-01 DIAGNOSIS — I10 Essential (primary) hypertension: Secondary | ICD-10-CM

## 2015-12-01 MED ORDER — NITROGLYCERIN 0.4 MG SL SUBL: 0.4000 mg | SUBLINGUAL_TABLET | SUBLINGUAL | 3 refills | Status: DC | PRN

## 2015-12-01 NOTE — Progress Notes (Signed)
BP 120/80 (BP Location: Left Arm, Patient Position: Sitting, Cuff Size: Normal)   Pulse 77   Temp 98.1 F (36.7 C) (Other (Comment))   Ht 5' 8.5" (1.74 m)   Wt 223 lb 3.2 oz (101.2 kg)   SpO2 96%   BMI 33.44 kg/m    Subjective:    Patient ID: Robert Harding, male    DOB: [birth year], 50 y.o.   MRN: [id number]  HPI: Robert PostWilliam Metayer is a 50 y.o. male presenting on 12/01/2015 for Follow-up (BP meds)   HPI   Pt is in today stating that his bp has been low recently.  Says he has been getting dizzy and his bp has been low when he checks it.    Pt takes 3 meds but he doesn't have any idea what he is taking- he thinks coreg, lisinopril and hctz.  He had his roommate send pictures of his medicine bottles and these do appear to be what he is taking.    Pt says he has enlarged heart- he said that he was told this after heart cath. Pt had LHC in [place] two or three years ago.  He started with chest pains during exertion about 2 wk ago.  He states cp with exertion.  No cp at rest.  Some sob also.  Pt has not yet turned in his [employer] discount application that he was given some months ago.    Relevant past medical, surgical, family and social history reviewed and updated as indicated. Interim medical history since our last visit reviewed. Allergies and medications reviewed and updated.   Current Outpatient Prescriptions:  .  Calcium-Magnesium-Vitamin D (CALCIUM 500 PO), Take 1,000 mg by mouth daily. Reported on 09/29/2015, Disp: , Rfl:  .  buPROPion (WELLBUTRIN XL) 300 MG 24 hr tablet, Take 1 tablet (300 mg total) by mouth daily. (Patient not taking: Reported on 10/26/2015), Disp: 30 tablet, Rfl: 0 .  carvedilol (COREG) 25 MG tablet, Take 1 tablet (25 mg total) by mouth 2 (two) times daily with a meal., Disp: 180 tablet, Rfl: 1 .  hydrochlorothiazide (HYDRODIURIL) 25 MG tablet, Take 1 tablet (25 mg total) by mouth daily. (Patient not taking: Reported on 12/01/2015), Disp: 90 tablet, Rfl: 1 .   HYDROcodone-acetaminophen (NORCO/VICODIN) 5-325 MG tablet, Take 1 tablet by mouth every 6 (six) hours as needed for moderate pain. (Patient not taking: Reported on 10/26/2015), Disp: 20 tablet, Rfl: 0 .  hydrOXYzine (VISTARIL) 50 MG capsule, Take 50 mg by mouth at bedtime as needed (for sleep). Reported on 10/26/2015, Disp: , Rfl:  .  lisinopril (PRINIVIL,ZESTRIL) 40 MG tablet, Take 40 mg by mouth daily., Disp: , Rfl: 1 .  naproxen (NAPROSYN) 500 MG tablet, Take 1 tablet (500 mg total) by mouth 2 (two) times daily. (Patient not taking: Reported on 10/26/2015), Disp: 30 tablet, Rfl: 0   Review of Systems  Constitutional: Positive for fatigue. Negative for appetite change, chills, diaphoresis, fever and unexpected weight change.  HENT: Positive for mouth sores. Negative for congestion, drooling, ear pain, facial swelling, hearing loss, sneezing, sore throat, trouble swallowing and voice change.   Eyes: Negative for pain, discharge, redness, itching and visual disturbance.  Respiratory: Negative for cough, choking, shortness of breath and wheezing.   Cardiovascular: Positive for chest pain. Negative for palpitations and leg swelling.  Gastrointestinal: Positive for abdominal pain and constipation. Negative for blood in stool, diarrhea and vomiting.  Endocrine: Negative for cold intolerance, heat intolerance and polydipsia.  Genitourinary: Negative for decreased urine  volume, dysuria and hematuria.  Musculoskeletal: Positive for arthralgias, back pain and gait problem.  Skin: Negative for rash.  Allergic/Immunologic: Negative for environmental allergies.  Neurological: Positive for headaches. Negative for seizures, syncope and light-headedness.  Hematological: Negative for adenopathy.  Psychiatric/Behavioral: Positive for dysphoric mood. Negative for agitation and suicidal ideas. The patient is not nervous/anxious.     Per HPI unless specifically indicated above     Objective:    BP 120/80 (BP  Location: Left Arm, Patient Position: Sitting, Cuff Size: Normal)   Pulse 77   Temp 98.1 F (36.7 C) (Other (Comment))   Ht 5' 8.5" (1.74 m)   Wt 223 lb 3.2 oz (101.2 kg)   SpO2 96%   BMI 33.44 kg/m   Wt Readings from Last 3 Encounters:  12/01/15 223 lb 3.2 oz (101.2 kg)  10/26/15 216 lb (98 kg)  10/15/15 209 lb (94.8 kg)    Physical Exam  Constitutional: He is oriented to person, place, and time. He appears well-developed and well-nourished.  HENT:  Head: Normocephalic and atraumatic.  Neck: Neck supple.  Cardiovascular: Normal rate and regular rhythm.   Pulmonary/Chest: Effort normal and breath sounds normal. He has no wheezes.  Abdominal: Soft. Bowel sounds are normal. There is no hepatosplenomegaly. There is no tenderness.  Musculoskeletal: He exhibits no edema.  Lymphadenopathy:    He has no cervical adenopathy.  Neurological: He is alert and oriented to person, place, and time.  Skin: Skin is warm and dry.  Psychiatric: He has a normal mood and affect. His behavior is normal.  Vitals reviewed.   EKG -NSR no changes    Assessment & Plan:   Encounter Diagnoses  Name Primary?  . Essential hypertension, benign Yes  . Chest pain, unspecified chest pain type   . Hyperlipidemia   . Cigarette nicotine dependence without complication   . Obesity, unspecified      -Pt will be referred to cardiology.  Discussed with him that he needs to get his [employer] discount application turned in.  [facility] -he is to Stay hydrated -pt urged to Avoid exertion -he is given rx nitrostat and counseled on how to properly take it, including going to the ER for CP that persists after 3 doses -Start daily asa -pt counseled to stop his lisinopril and hctz due to low blood pressures.  He is to continue the carvedilol. -pt counseled to go to [facility] for anxiety -f/u one month as scheduled.  RTO sooner prn

## 2015-12-01 NOTE — Patient Instructions (Addendum)
Stop the lisinopril and hctz.   Continue the carvedilol.   Avoid exertion. Aspirin one daily Nitroglycerin as discussed   Nitroglycerin sublingual tablets What is this medicine? NITROGLYCERIN (nye troe GLI ser in) is a type of vasodilator. It relaxes blood vessels, increasing the blood and oxygen supply to your heart. This medicine is used to relieve chest pain caused by angina. It is also used to prevent chest pain before activities like climbing stairs, going outdoors in cold weather, or sexual activity. This medicine may be used for other purposes; ask your health care provider or pharmacist if you have questions. What should I tell my health care provider before I take this medicine? They need to know if you have any of these conditions: -anemia -head injury, recent stroke, or bleeding in the brain -liver disease -previous heart attack -an unusual or allergic reaction to nitroglycerin, other medicines, foods, dyes, or preservatives -pregnant or trying to get pregnant -breast-feeding How should I use this medicine? Take this medicine by mouth as needed. At the first sign of an angina attack (chest pain or tightness) place one tablet under your tongue. You can also take this medicine 5 to 10 minutes before an event likely to produce chest pain. Follow the directions on the prescription label. Let the tablet dissolve under the tongue. Do not swallow whole. Replace the dose if you accidentally swallow it. It will help if your mouth is not dry. Saliva around the tablet will help it to dissolve more quickly. Do not eat or drink, smoke or chew tobacco while a tablet is dissolving. If you are not better within 5 minutes after taking ONE dose of nitroglycerin, call 9-1-1 immediately to seek emergency medical care. Do not take more than 3 nitroglycerin tablets over 15 minutes. If you take this medicine often to relieve symptoms of angina, your doctor or health care professional may provide you with  different instructions to manage your symptoms. If symptoms do not go away after following these instructions, it is important to call 9-1-1 immediately. Do not take more than 3 nitroglycerin tablets over 15 minutes. Talk to your pediatrician regarding the use of this medicine in children. Special care may be needed. Overdosage: If you think you have taken too much of this medicine contact a poison control center or emergency room at once. NOTE: This medicine is only for you. Do not share this medicine with others. What if I miss a dose? This does not apply. This medicine is only used as needed. What may interact with this medicine? Do not take this medicine with any of the following medications: -certain migraine medicines like ergotamine and dihydroergotamine (DHE) -medicines used to treat erectile dysfunction like sildenafil, tadalafil, and vardenafil -riociguat This medicine may also interact with the following medications: -alteplase -aspirin -heparin -medicines for high blood pressure -medicines for mental depression -other medicines used to treat angina -phenothiazines like chlorpromazine, mesoridazine, prochlorperazine, thioridazine This list may not describe all possible interactions. Give your health care provider a list of all the medicines, herbs, non-prescription drugs, or dietary supplements you use. Also tell them if you smoke, drink alcohol, or use illegal drugs. Some items may interact with your medicine. What should I watch for while using this medicine? Tell your doctor or health care professional if you feel your medicine is no longer working. Keep this medicine with you at all times. Sit or lie down when you take your medicine to prevent falling if you feel dizzy or faint after using  it. Try to remain calm. This will help you to feel better faster. If you feel dizzy, take several deep breaths and lie down with your feet propped up, or bend forward with your head resting  between your knees. You may get drowsy or dizzy. Do not drive, use machinery, or do anything that needs mental alertness until you know how this drug affects you. Do not stand or sit up quickly, especially if you are an older patient. This reduces the risk of dizzy or fainting spells. Alcohol can make you more drowsy and dizzy. Avoid alcoholic drinks. Do not treat yourself for coughs, colds, or pain while you are taking this medicine without asking your doctor or health care professional for advice. Some ingredients may increase your blood pressure. What side effects may I notice from receiving this medicine? Side effects that you should report to your doctor or health care professional as soon as possible: -blurred vision -dry mouth -skin rash -sweating -the feeling of extreme pressure in the head -unusually weak or tired Side effects that usually do not require medical attention (report to your doctor or health care professional if they continue or are bothersome): -flushing of the face or neck -headache -irregular heartbeat, palpitations -nausea, vomiting This list may not describe all possible side effects. Call your doctor for medical advice about side effects. You may report side effects to FDA at 1-800-FDA-1088. Where should I keep my medicine? Keep out of the reach of children. Store at room temperature between 20 and 25 degrees C (68 and 77 degrees F). Store in Retail buyeroriginal container. Protect from light and moisture. Keep tightly closed. Throw away any unused medicine after the expiration date. NOTE: This sheet is a summary. It may not cover all possible information. If you have questions about this medicine, talk to your doctor, pharmacist, or health care provider.    2016, Elsevier/Gold Standard. (2013-01-24 17:57:36)

## 2015-12-02 ENCOUNTER — Encounter: Payer: Self-pay | Admitting: Physician Assistant

## 2015-12-07 ENCOUNTER — Encounter: Payer: Self-pay | Admitting: Physician Assistant
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
## 2015-12-09 ENCOUNTER — Ambulatory Visit (INDEPENDENT_AMBULATORY_CARE_PROVIDER_SITE_OTHER): Payer: Self-pay | Admitting: Cardiovascular Disease

## 2015-12-09 ENCOUNTER — Encounter: Payer: Self-pay | Admitting: Cardiovascular Disease

## 2015-12-09 VITALS — BP 127/86 | HR 64 | Ht 69.0 in | Wt 222.0 lb

## 2015-12-09 DIAGNOSIS — R0602 Shortness of breath: Secondary | ICD-10-CM

## 2015-12-09 NOTE — Patient Instructions (Signed)
Your physician wants you to follow-up in: 1 Year with Dr. Nishan. You will receive a reminder letter in the mail two months in advance. If you don't receive a letter, please call our office to schedule the follow-up appointment.  Your physician recommends that you continue on your current medications as directed. Please refer to the Current Medication list given to you today.  Your physician has requested that you have an echocardiogram. Echocardiography is a painless test that uses sound waves to create images of your heart. It provides your doctor with information about the size and shape of your heart and how well your heart's chambers and valves are working. This procedure takes approximately one hour. There are no restrictions for this procedure.  If you need a refill on your cardiac medications before your next appointment, please call your pharmacy.  Thank you for choosing [facility]!   

## 2015-12-24 ENCOUNTER — Ambulatory Visit (HOSPITAL_COMMUNITY)
Admission: RE | Admit: 2015-12-24 | Discharge: 2015-12-24 | Disposition: A | Discharge status: Home / Self Care | Payer: Self-pay | Source: Ambulatory Visit | Attending: Cardiovascular Disease | Admitting: Cardiovascular Disease

## 2015-12-24 DIAGNOSIS — R0602 Shortness of breath: Secondary | ICD-10-CM

## 2015-12-24 LAB — ECHOCARDIOGRAM COMPLETE
E decel time: 271 msec
EERAT: 9.81
FS: 31 % (ref 28–44)
IV/PV OW: 0.92
LA ID, A-P, ES: 41 mm
LA diam end sys: 41 mm
LA vol A4C: 39.9 ml
LA vol index: 20.2 mL/m2
LADIAMINDEX: 1.9 cm/m2
LAVOL: 43.6 mL
LDCA: 3.14 cm2
LV E/e' medial: 9.81
LV E/e'average: 9.81
LV TDI E'LATERAL: 10.3
LV e' LATERAL: 10.3 cm/s
LVOTD: 20 mm
MV Dec: 271
MVPG: 4 mmHg
MVPKAVEL: 76.2 m/s
MVPKEVEL: 101 m/s
PW: 14.6 mm — AB (ref 0.6–1.1)
RV TAPSE: 17.5 mm
TDI e' medial: 7.29

## 2015-12-24 NOTE — Progress Notes (Signed)
*  PRELIMINARY RESULTS* Echocardiogram 2D Echocardiogram has been performed.  Robert Harding, Robert Harding 12/24/2015, 9:50 AM

## 2015-12-29 ENCOUNTER — Encounter: Payer: Self-pay | Admitting: Physician Assistant

## 2015-12-29 NOTE — Progress Notes (Signed)
Pt called in regards to his carvedilol. Pt was told by provider to take carvedilol 25 mg bid, pt states 25 mg is not enough due to his systolic BP being over 145. Pt states he increased his medication on his own and has been taking carvedilol 50 mg bid. Pt states now his bp has been running around 120/75.  After discussing with PA, pt was advised to continue carvedilol 25 mg bid as initially instructed. Pt is to not change dosage on his BP medications on his own due to it can be very dangerous for the patient. Pt is to call the office if he has any problems. Pt was reminded of f/u appt in Oct. Pt verbalized understanding and stated he would go back to carvedilol 25 mg bid.

## 2016-01-11 ENCOUNTER — Ambulatory Visit: Payer: Self-pay

## 2016-01-25 ENCOUNTER — Ambulatory Visit: Payer: Self-pay | Admitting: Physician Assistant

## 2016-01-25 ENCOUNTER — Encounter: Payer: Self-pay | Admitting: Physician Assistant

## 2016-01-25 VITALS — BP 116/86 | HR 60 | Temp 97.7°F | Ht 69.0 in | Wt 228.5 lb

## 2016-01-25 DIAGNOSIS — E785 Hyperlipidemia, unspecified: Secondary | ICD-10-CM

## 2016-01-25 DIAGNOSIS — E669 Obesity, unspecified: Secondary | ICD-10-CM

## 2016-01-25 DIAGNOSIS — Z6833 Body mass index (BMI) 33.0-33.9, adult: Secondary | ICD-10-CM

## 2016-01-25 DIAGNOSIS — I10 Essential (primary) hypertension: Secondary | ICD-10-CM

## 2016-01-25 DIAGNOSIS — F17210 Nicotine dependence, cigarettes, uncomplicated: Secondary | ICD-10-CM

## 2016-01-25 MED ORDER — CARVEDILOL 25 MG PO TABS: 25.0000 mg | ORAL_TABLET | Freq: Two times a day (BID) | ORAL | 1 refills | Status: DC

## 2016-01-25 NOTE — Progress Notes (Signed)
BP 116/86 (BP Location: Left Arm, Patient Position: Sitting, Cuff Size: Normal)   Pulse 60   Temp 97.7 F (36.5 C)   Ht 5\' 9"  (1.753 m)   Wt 228 lb 8 oz (103.6 kg)   SpO2 93%   BMI 33.74 kg/m    Subjective:    Patient ID: Robert Harding, male    DOB: 10-Oct-1965, 50 y.o.   MRN: [id number]  HPI: Robert Harding is a 50 y.o. male presenting on 01/25/2016 for Hypertension and Hyperlipidemia   HPI Pt again didn't bring his meds. Says the only thing he is taking is the "c" med.  Pt went to cardiology- notes and echo reviewed. He says he has improved his diet.  He is still smoking some.   Relevant past medical, surgical, family and social history reviewed and updated as indicated. Interim medical history since our last visit reviewed. Allergies and medications reviewed and updated.   Current Outpatient Prescriptions:  .  carvedilol (COREG) 25 MG tablet, Take 1 tablet (25 mg total) by mouth 2 (two) times daily with a meal., Disp: 180 tablet, Rfl: 1   Review of Systems  Constitutional: Negative for appetite change, chills, diaphoresis, fatigue, fever and unexpected weight change.  HENT: Negative for congestion, dental problem, drooling, ear pain, facial swelling, hearing loss, mouth sores, sneezing, sore throat, trouble swallowing and voice change.   Eyes: Negative for pain, discharge, redness, itching and visual disturbance.  Respiratory: Negative for cough, choking, shortness of breath and wheezing.   Cardiovascular: Negative for chest pain, palpitations and leg swelling.  Gastrointestinal: Negative for abdominal pain, blood in stool, constipation, diarrhea and vomiting.  Endocrine: Negative for cold intolerance, heat intolerance and polydipsia.  Genitourinary: Negative for decreased urine volume, dysuria and hematuria.  Musculoskeletal: Negative for arthralgias, back pain and gait problem.  Skin: Negative for rash.  Allergic/Immunologic: Negative for environmental allergies.   Neurological: Positive for headaches. Negative for seizures, syncope and light-headedness.  Hematological: Negative for adenopathy.  Psychiatric/Behavioral: Negative for agitation, dysphoric mood and suicidal ideas. The patient is not nervous/anxious.     Per HPI unless specifically indicated above     Objective:    BP 116/86 (BP Location: Left Arm, Patient Position: Sitting, Cuff Size: Normal)   Pulse 60   Temp 97.7 F (36.5 C)   Ht 5\' 9"  (1.753 m)   Wt 228 lb 8 oz (103.6 kg)   SpO2 93%   BMI 33.74 kg/m   Wt Readings from Last 3 Encounters:  01/25/16 228 lb 8 oz (103.6 kg)  12/09/15 222 lb (100.7 kg)  12/01/15 223 lb 3.2 oz (101.2 kg)    Physical Exam  Constitutional: He is oriented to person, place, and time. He appears well-developed and well-nourished.  HENT:  Head: Normocephalic and atraumatic.  Neck: Neck supple.  Cardiovascular: Normal rate and regular rhythm.   Pulmonary/Chest: Effort normal and breath sounds normal. He has no wheezes.  Abdominal: Soft. Bowel sounds are normal. There is no hepatosplenomegaly. There is no tenderness.  Musculoskeletal: He exhibits no edema.  Lymphadenopathy:    He has no cervical adenopathy.  Neurological: He is alert and oriented to person, place, and time.  Skin: Skin is warm and dry.  Psychiatric: He has a normal mood and affect. His behavior is normal.  Vitals reviewed.      Assessment & Plan:   Encounter Diagnoses  Name Primary?  . Hyperlipidemia   . Essential hypertension, benign   . Cigarette nicotine dependence without complication  Yes  . Class 1 obesity with body mass index (BMI) of 33.0 to 33.9 in adult, unspecified obesity type, unspecified whether serious comorbidity present      -Pt to get fasting labs drawn- will call with results -counseled on smoking cessation -follow up 3 months.  RTO sooner prn

## 2016-01-27 LAB — COMPLETE METABOLIC PANEL WITH GFR
ALT: 29 U/L (ref 9–46)
AST: 23 U/L (ref 10–40)
Albumin: 3.8 g/dL (ref 3.6–5.1)
Alkaline Phosphatase: 45 U/L (ref 40–115)
BUN: 16 mg/dL (ref 7–25)
CO2: 26 mmol/L (ref 20–31)
Calcium: 8.9 mg/dL (ref 8.6–10.3)
Chloride: 105 mmol/L (ref 98–110)
Creat: 1.08 mg/dL (ref 0.60–1.35)
GFR, EST NON AFRICAN AMERICAN: 80 mL/min (ref >=60)
GFR, Est African American: >89 mL/min (ref >=60)
GLUCOSE: 104 mg/dL — AB (ref 65–99)
POTASSIUM: 4.5 mmol/L (ref 3.5–5.3)
SODIUM: 140 mmol/L (ref 135–146)
Total Bilirubin: 0.4 mg/dL (ref 0.2–1.2)
Total Protein: 6.2 g/dL (ref 6.1–8.1)

## 2016-01-27 LAB — LIPID PANEL
CHOL/HDL RATIO: 5.6 ratio — AB (ref <=5.0)
Cholesterol: 200 mg/dL (ref 125–200)
HDL: 36 mg/dL — AB (ref >=40)
LDL CALC: 127 mg/dL (ref <130)
Triglycerides: 183 mg/dL — ABNORMAL HIGH (ref <150)
VLDL: 37 mg/dL — ABNORMAL HIGH (ref <30)

## 2016-03-09 ENCOUNTER — Emergency Department (HOSPITAL_COMMUNITY)
Admission: EM | Admit: 2016-03-09 | Discharge: 2016-03-09 | Disposition: A | Discharge status: Home / Self Care | Payer: Self-pay | Attending: Emergency Medicine | Admitting: Emergency Medicine

## 2016-03-09 ENCOUNTER — Encounter (HOSPITAL_COMMUNITY): Payer: Self-pay | Admitting: *Deleted

## 2016-03-09 ENCOUNTER — Emergency Department (HOSPITAL_COMMUNITY): Payer: Self-pay

## 2016-03-09 DIAGNOSIS — S8392XA Sprain of unspecified site of left knee, initial encounter: Secondary | ICD-10-CM | POA: Insufficient documentation

## 2016-03-09 DIAGNOSIS — Y929 Unspecified place or not applicable: Secondary | ICD-10-CM | POA: Insufficient documentation

## 2016-03-09 DIAGNOSIS — I10 Essential (primary) hypertension: Secondary | ICD-10-CM | POA: Insufficient documentation

## 2016-03-09 DIAGNOSIS — Y9301 Activity, walking, marching and hiking: Secondary | ICD-10-CM | POA: Insufficient documentation

## 2016-03-09 DIAGNOSIS — F17210 Nicotine dependence, cigarettes, uncomplicated: Secondary | ICD-10-CM | POA: Insufficient documentation

## 2016-03-09 DIAGNOSIS — Y999 Unspecified external cause status: Secondary | ICD-10-CM | POA: Insufficient documentation

## 2016-03-09 DIAGNOSIS — X58XXXA Exposure to other specified factors, initial encounter: Secondary | ICD-10-CM | POA: Insufficient documentation

## 2016-03-09 MED ORDER — HYDROCODONE-ACETAMINOPHEN 7.5-325 MG PO TABS: 1.0000 | ORAL_TABLET | ORAL | 0 refills | Status: DC | PRN

## 2016-03-09 MED ORDER — IBUPROFEN 800 MG PO TABS
800.0000 mg | ORAL_TABLET | Freq: Once | ORAL | Status: AC
  Administered 2016-03-09: 800 mg via ORAL
  Filled 2016-03-09: qty 1

## 2016-03-09 MED ORDER — DICLOFENAC SODIUM 75 MG PO TBEC: 75.0000 mg | DELAYED_RELEASE_TABLET | Freq: Two times a day (BID) | ORAL | 0 refills | Status: DC

## 2016-03-09 NOTE — ED Triage Notes (Addendum)
Pt reports left knee pain that started 2 days ago. Pt reports he was diagnosed with gout in his left great toe last Tuesday and has been "favoring" his left foot while walking and feels he may have missed up his knee while "favoring" the foot. Pt denies injury to the knee. Pt ambulatory, but limping in triage.

## 2016-03-09 NOTE — Discharge Instructions (Signed)
Apply ice packs on/off to your knee.  Use the crutches for weight bearing. Do not wear the ACE wrap continuously.  Call the orthopedic doctor listed to arrange a follow-up appt. If not improving
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
## 2016-03-10 ENCOUNTER — Ambulatory Visit: Payer: Self-pay | Admitting: Physician Assistant

## 2016-03-10 ENCOUNTER — Encounter: Payer: Self-pay | Admitting: Physician Assistant

## 2016-03-10 VITALS — BP 160/110 | HR 90 | Temp 98.2°F | Ht 69.0 in | Wt 224.0 lb

## 2016-03-10 DIAGNOSIS — M109 Gout, unspecified: Secondary | ICD-10-CM

## 2016-03-10 DIAGNOSIS — I10 Essential (primary) hypertension: Secondary | ICD-10-CM

## 2016-03-10 MED ORDER — INDOMETHACIN 25 MG PO CAPS: 25.0000 mg | ORAL_CAPSULE | Freq: Three times a day (TID) | ORAL | 1 refills | Status: DC | PRN

## 2016-03-10 MED ORDER — LISINOPRIL 10 MG PO TABS: 10.0000 mg | ORAL_TABLET | Freq: Every day | ORAL | 3 refills | Status: DC

## 2016-03-10 NOTE — Patient Instructions (Addendum)
Stop diclofenac.  Take indomethicin.    Gout Gout is painful swelling that can occur in some of your joints. Gout is a type of arthritis. This condition is caused by having too much uric acid in your body. Uric acid is a chemical that forms when your body breaks down substances called purines. Purines are important for building body proteins. When your body has too much uric acid, sharp crystals can form and build up inside your joints. This causes pain and swelling. Gout attacks can happen quickly and be very painful (acute gout). Over time, the attacks can affect more joints and become more frequent (chronic gout). Gout can also cause uric acid to build up under your skin and inside your kidneys. What are the causes? This condition is caused by too much uric acid in your blood. This can occur because:  Your kidneys do not remove enough uric acid from your blood. This is the most common cause.  Your body makes too much uric acid. This can occur with some cancers and cancer treatments. It can also occur if your body is breaking down too many red blood cells (hemolytic anemia).  You eat too many foods that are high in purines. These foods include organ meats and some seafood. Alcohol, especially beer, is also high in purines. A gout attack may be triggered by trauma or stress. What increases the risk? This condition is more likely to develop in people who:  Have a family history of gout.  Are male and middle-aged.  Are male and have gone through menopause.  Are obese.  Frequently drink alcohol, especially beer.  Are dehydrated.  Lose weight too quickly.  Have an organ transplant.  Have lead poisoning.  Take certain medicines, including aspirin, cyclosporine, diuretics, levodopa, and niacin.  Have kidney disease or psoriasis. What are the signs or symptoms? An attack of acute gout happens quickly. It usually occurs in just one joint. The most common place is the big toe.  Attacks often start at night. Other joints that may be affected include joints of the feet, ankle, knee, fingers, wrist, or elbow. Symptoms may include:  Severe pain.  Warmth.  Swelling.  Stiffness.  Tenderness. The affected joint may be very painful to touch.  Shiny, red, or purple skin.  Chills and fever. Chronic gout may cause symptoms more frequently. More joints may be involved. You may also have white or yellow lumps (tophi) on your hands or feet or in other areas near your joints. How is this diagnosed? This condition is diagnosed based on your symptoms, medical history, and physical exam. You may have tests, such as:  Blood tests to measure uric acid levels.  Removal of joint fluid with a needle (aspiration) to look for uric acid crystals.  X-rays to look for joint damage. How is this treated? Treatment for this condition has two phases: treating an acute attack and preventing future attacks. Acute gout treatment may include medicines to reduce pain and swelling, including:  NSAIDs.  Steroids. These are strong anti-inflammatory medicines that can be taken by mouth (orally) or injected into a joint.  Colchicine. This medicine relieves pain and swelling when it is taken soon after an attack. It can be given orally or through an IV tube. Preventive treatment may include:  Daily use of smaller doses of NSAIDs or colchicine.  Use of a medicine that reduces uric acid levels in your blood.  Changes to your diet. You may need to see a specialist about  healthy eating (dietitian). Follow these instructions at home: During a Gout Attack  If directed, apply ice to the affected area:  Put ice in a plastic bag.  Place a towel between your skin and the bag.  Leave the ice on for 20 minutes, 2-3 times a day.  Rest the joint as much as possible. If the affected joint is in your leg, you may be given crutches to use.  Raise (elevate) the affected joint above the level of  your heart as often as possible.  Drink enough fluids to keep your urine clear or pale yellow.  Take over-the-counter and prescription medicines only as told by your health care provider.  Do not drive or operate heavy machinery while taking prescription pain medicine.  Follow instructions from your health care provider about eating or drinking restrictions.  Return to your normal activities as told by your health care provider. Ask your health care provider what activities are safe for you. Avoiding Future Gout Attacks  Follow a low-purine diet as told by your dietitian or health care provider. Avoid foods and drinks that are high in purines, including liver, kidney, anchovies, asparagus, herring, mushrooms, mussels, and beer.  Limit alcohol intake to no more than 1 drink a day for nonpregnant women and 2 drinks a day for men. One drink equals 12 oz of beer, 5 oz of wine, or 1 oz of hard liquor.  Maintain a healthy weight or lose weight if you are overweight. If you want to lose weight, talk with your health care provider. It is important that you do not lose weight too quickly.  Start or maintain an exercise program as told by your health care provider.  Drink enough fluids to keep your urine clear or pale yellow.  Take over-the-counter and prescription medicines only as told by your health care provider.  Keep all follow-up visits as told by your health care provider. This is important. Contact a health care provider if:  You have another gout attack.  You continue to have symptoms of a gout attack after10 days of treatment.  You have side effects from your medicines.  You have chills or a fever.  You have burning pain when you urinate.  You have pain in your lower back or belly. Get help right away if:  You have severe or uncontrolled pain.  You cannot urinate. This information is not intended to replace advice given to you by your health care provider. Make sure you  discuss any questions you have with your health care provider. Document Released: 03/25/2000 Document Revised: 09/03/2015 Document Reviewed: 01/08/2015 Elsevier Interactive Patient Education  2017 [employer]

## 2016-03-10 NOTE — Progress Notes (Signed)
BP (!) 160/110 (BP Location: Left Arm, Patient Position: Sitting, Cuff Size: Normal)   Pulse 90   Temp 98.2 F (36.8 C)   Ht 5\' 9"  (1.753 m)   Wt 224 lb (101.6 kg)   SpO2 97%   BMI 33.08 kg/m    Subjective:    Patient ID: Robert Harding, male    DOB: May 21, 1965, 50 y.o.   MRN: [id number]  HPI: Robert Harding is a 50 y.o. male presenting on 03/10/2016 for Gout   HPI  Pt with hx gout  .  He thinks flare this time is  from eating summer sausage. Pain, swelling  L great toe gout. 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 He Thinks that's why his bp is up  Relevant past medical, surgical, family and social history reviewed and updated as indicated. Interim medical history since our last visit reviewed. Allergies and medications reviewed and updated.   Current Outpatient Prescriptions:  .  carvedilol (COREG) 25 MG tablet, Take 1 tablet (25 mg total) by mouth 2 (two) times daily with a meal., Disp: 180 tablet, Rfl: 1 .  diclofenac (VOLTAREN) 75 MG EC tablet, Take 1 tablet (75 mg total) by mouth 2 (two) times daily. Take with food, Disp: 14 tablet, Rfl: 0 .  HYDROcodone-acetaminophen (NORCO) 7.5-325 MG tablet, Take 1 tablet by mouth every 4 (four) hours as needed for moderate pain., Disp: 15 tablet, Rfl: 0   Review of Systems  Constitutional: Negative for appetite change, chills, diaphoresis, fatigue, fever and unexpected weight change.  HENT: Negative for congestion, dental problem, drooling, ear pain, facial swelling, hearing loss, mouth sores, sneezing, sore throat, trouble swallowing and voice change.   Eyes: Negative for pain, discharge, redness, itching and visual disturbance.  Respiratory: Negative for cough, choking, shortness of breath and wheezing.   Cardiovascular: Positive for leg swelling. Negative for chest pain and palpitations.  Gastrointestinal: Negative for abdominal pain, blood in stool, constipation, diarrhea and vomiting.  Endocrine: Negative for  cold intolerance, heat intolerance and polydipsia.  Genitourinary: Negative for decreased urine volume, dysuria and hematuria.  Musculoskeletal: Positive for arthralgias and gait problem. Negative for back pain.  Skin: Negative for rash.  Allergic/Immunologic: Negative for environmental allergies.  Neurological: Negative for seizures, syncope, light-headedness and headaches.  Hematological: Negative for adenopathy.  Psychiatric/Behavioral: Negative for agitation, dysphoric mood and suicidal ideas. The patient is not nervous/anxious.     Per HPI unless specifically indicated above     Objective:    BP (!) 160/110 (BP Location: Left Arm, Patient Position: Sitting, Cuff Size: Normal)   Pulse 90   Temp 98.2 F (36.8 C)   Ht 5\' 9"  (1.753 m)   Wt 224 lb (101.6 kg)   SpO2 97%   BMI 33.08 kg/m   Wt Readings from Last 3 Encounters:  03/10/16 224 lb (101.6 kg)  03/09/16 225 lb (102.1 kg)  01/25/16 228 lb 8 oz (103.6 kg)    Physical Exam  Constitutional: He is oriented to person, place, and time. He appears well-developed and well-nourished.  HENT:  Head: Normocephalic and atraumatic.  Neck: Neck supple.  Cardiovascular: Normal rate and regular rhythm.   Pulmonary/Chest: Effort normal and breath sounds normal. He has no wheezes.  Musculoskeletal: He exhibits no edema.       Left foot: There is tenderness and swelling.       Feet:  Redness- all at 1st MTP  Lymphadenopathy:    He has no cervical adenopathy.  Neurological: He  is alert and oriented to person, place, and time.  Skin: Skin is warm and dry.  Psychiatric: He has a normal mood and affect. His behavior is normal.  Vitals reviewed.       Assessment & Plan:    Encounter Diagnoses  Name Primary?  . Acute gout of left foot, unspecified cause Yes  . Essential hypertension, benign     -rx indocin.  Pt instructed Do not take diclofenac.  Counseled on  Low purine diet. Ice, elevate.  Gave handout on gout -restart  lisinopril -follow up as scheduled.  RTO sooner prn

## 2016-04-25 ENCOUNTER — Encounter: Payer: Self-pay | Admitting: Physician Assistant

## 2016-04-25 ENCOUNTER — Ambulatory Visit: Payer: Self-pay | Admitting: Physician Assistant

## 2016-04-25 VITALS — BP 142/94 | HR 93 | Temp 98.8°F | Ht 69.0 in | Wt 226.8 lb

## 2016-04-25 DIAGNOSIS — J069 Acute upper respiratory infection, unspecified: Secondary | ICD-10-CM

## 2016-04-25 DIAGNOSIS — E669 Obesity, unspecified: Secondary | ICD-10-CM

## 2016-04-25 DIAGNOSIS — I10 Essential (primary) hypertension: Secondary | ICD-10-CM

## 2016-04-25 DIAGNOSIS — Z6833 Body mass index (BMI) 33.0-33.9, adult: Secondary | ICD-10-CM

## 2016-04-25 MED ORDER — LISINOPRIL 20 MG PO TABS
20.0000 mg | ORAL_TABLET | Freq: Every day | ORAL | 2 refills | Status: DC
Start: 2016-04-25 — End: 2016-08-02

## 2016-04-25 MED ORDER — AMOXICILLIN 500 MG PO CAPS: 500.0000 mg | ORAL_CAPSULE | Freq: Three times a day (TID) | ORAL | 0 refills | Status: DC

## 2016-04-25 MED ORDER — BENZONATATE 100 MG PO CAPS: ORAL_CAPSULE | ORAL | 3 refills | Status: DC

## 2016-04-25 NOTE — Progress Notes (Signed)
BP (!) 142/94 (BP Location: Left Arm, Patient Position: Sitting, Cuff Size: Normal)   Pulse 93   Temp 98.8 F (37.1 C)   Ht 5\' 9"  (1.753 m)   Wt 226 lb 12 oz (102.9 kg)   SpO2 99%   BMI 33.49 kg/m    Subjective:    Patient ID: Robert Harding, male    DOB: 17-Aug-1965, 51 y.o.   MRN: [id number]  HPI: Robert Harding is a 51 y.o. 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   Pt thinks his bp is up today due to OTC cold medications but he doesn't remember what he is taking.  C/o congestion for about 2 weeks. green mucous, cough, sneezing, little ear pain. sore throat. pt has used OTC meds. pt states not helpful  Relevant past medical, surgical, family and social history reviewed and updated as indicated. Interim medical history since our last visit reviewed. Allergies and medications reviewed and updated.   Current Outpatient Prescriptions:  .  carvedilol (COREG) 25 MG tablet, Take 1 tablet (25 mg total) by mouth 2 (two) times daily with a meal., Disp: 180 tablet, Rfl: 1 .  lisinopril (PRINIVIL,ZESTRIL) 10 MG tablet, Take 1 tablet (10 mg total) by mouth daily., Disp: 30 tablet, Rfl: 3   Review of Systems  Constitutional: Negative for appetite change, chills, diaphoresis, fatigue, fever and unexpected weight change.  HENT: Positive for congestion, sneezing, sore throat, trouble swallowing and voice change. Negative for dental problem, drooling, ear pain, facial swelling, hearing loss and mouth sores.   Eyes: Negative for pain, discharge, redness, itching and visual disturbance.  Respiratory: Positive for cough, chest tightness and wheezing. Negative for choking and shortness of breath.   Cardiovascular: Negative for chest pain, palpitations and leg swelling.  Gastrointestinal: Positive for diarrhea. Negative for abdominal pain, blood in stool, constipation and vomiting.   Endocrine: Negative for cold intolerance, heat intolerance and polydipsia.  Genitourinary: Negative for decreased urine volume, dysuria and hematuria.  Musculoskeletal: Negative for arthralgias, back pain and gait problem.  Skin: Negative for rash.  Allergic/Immunologic: Positive for environmental allergies.  Neurological: Positive for headaches. Negative for seizures, syncope and light-headedness.  Hematological: Negative for adenopathy.  Psychiatric/Behavioral: Negative for agitation, dysphoric mood and suicidal ideas. The patient is not nervous/anxious.     Per HPI unless specifically indicated above     Objective:    BP (!) 142/94 (BP Location: Left Arm, Patient Position: Sitting, Cuff Size: Normal)   Pulse 93   Temp 98.8 F (37.1 C)   Ht 5\' 9"  (1.753 m)   Wt 226 lb 12 oz (102.9 kg)   SpO2 99%   BMI 33.49 kg/m   Wt Readings from Last 3 Encounters:  04/25/16 226 lb 12 oz (102.9 kg)  03/10/16 224 lb (101.6 kg)  03/09/16 225 lb (102.1 kg)    Physical Exam  Constitutional: He is oriented to person, place, and time. He appears well-developed and well-nourished.  HENT:  Head: Normocephalic and atraumatic.  Right Ear: Hearing, tympanic membrane, external ear and ear canal normal.  Left Ear: Hearing, tympanic membrane, external ear and ear canal normal.  Nose: Mucosal edema and rhinorrhea present.  Mouth/Throat: Uvula is midline and oropharynx is clear and moist. No uvula swelling. No oropharyngeal exudate, posterior oropharyngeal edema, posterior oropharyngeal erythema or tonsillar abscesses.  Neck: Neck supple.  Cardiovascular: Normal rate and regular rhythm.   Pulmonary/Chest: Effort normal and breath sounds normal. He has no wheezes.  Abdominal: Soft. Bowel sounds are normal. There is no hepatosplenomegaly. There is no tenderness.  Musculoskeletal: He exhibits no edema.  Lymphadenopathy:    He has no cervical adenopathy.  Neurological: He is alert and oriented to person,  place, and time.  Skin: Skin is warm and dry.  Psychiatric: He has a normal mood and affect. His behavior is normal.  Vitals reviewed.       Assessment & Plan:   Encounter Diagnoses  Name Primary?  . Essential hypertension, benign Yes  . Acute upper respiratory infection   . Class 1 obesity with body mass index (BMI) of 33.0 to 33.9 in adult, unspecified obesity type, unspecified whether serious comorbidity present      -Increase lisinopril -rx amoxil and tessalon for URI.  Counseled rest, fluids.  Discussed at length the medications that he should avoid with his BP -F/u 1 month to recheck bp. RTO sooner prn

## 2016-05-23 ENCOUNTER — Other Ambulatory Visit: Payer: Self-pay | Admitting: Student

## 2016-05-23 DIAGNOSIS — I10 Essential (primary) hypertension: Secondary | ICD-10-CM

## 2016-05-27 LAB — BASIC METABOLIC PANEL
BUN: 18 mg/dL (ref 7–25)
CHLORIDE: 102 mmol/L (ref 98–110)
CO2: 26 mmol/L (ref 20–31)
Calcium: 9.2 mg/dL (ref 8.6–10.3)
Creat: 1.14 mg/dL (ref 0.60–1.35)
Glucose, Bld: 64 mg/dL — ABNORMAL LOW (ref 65–99)
Potassium: 4.2 mmol/L (ref 3.5–5.3)
SODIUM: 138 mmol/L (ref 135–146)

## 2016-05-30 ENCOUNTER — Encounter: Payer: Self-pay | Admitting: Physician Assistant

## 2016-05-30 ENCOUNTER — Ambulatory Visit: Payer: Self-pay | Admitting: Physician Assistant

## 2016-05-30 VITALS — BP 140/92 | HR 78 | Temp 98.1°F | Ht 69.0 in | Wt 229.8 lb

## 2016-05-30 DIAGNOSIS — I10 Essential (primary) hypertension: Secondary | ICD-10-CM

## 2016-05-30 NOTE — Progress Notes (Signed)
BP (!) 140/92 (BP Location: Left Arm, Patient Position: Sitting, Cuff Size: Large)   Pulse 78   Temp 98.1 F (36.7 C)   Ht 5\' 9"  (1.753 m)   Wt 229 lb 12 oz (104.2 kg)   SpO2 97%   BMI 33.93 kg/m    Subjective:    Patient ID: Robert Harding, Robert Harding    DOB: 1966-01-18, 51 y.o.   MRN: [id number]  HPI: Robert Harding is a 51 y.o. Robert Harding presenting on 05/30/2016 for Hypertension   HPI   Pt says his BP was "low as hell" last week.  Specifically 101-120.  He says it was never over 130 last week.  He is checking it 3 or 4 times each day.  Pt out of lisinopril x 2 days  Relevant past medical, surgical, family and social history reviewed and updated as indicated. Interim medical history since our last visit reviewed. Allergies and medications reviewed and updated.   Current Outpatient Prescriptions:  .  carvedilol (COREG) 25 MG tablet, Take 1 tablet (25 mg total) by mouth 2 (two) times daily with a meal., Disp: 180 tablet, Rfl: 1 .  lisinopril (PRINIVIL,ZESTRIL) 20 MG tablet, Take 1 tablet (20 mg total) by mouth daily. (Patient not taking: Reported on 05/30/2016), Disp: 30 tablet, Rfl: 2   Review of Systems  Constitutional: Negative for appetite change, chills, diaphoresis, fatigue, fever and unexpected weight change.  HENT: Negative for congestion, dental problem, drooling, ear pain, facial swelling, hearing loss, mouth sores, sneezing, sore throat, trouble swallowing and voice change.   Eyes: Negative for pain, discharge, redness, itching and visual disturbance.  Respiratory: Negative for cough, choking, shortness of breath and wheezing.   Cardiovascular: Negative for chest pain, palpitations and leg swelling.  Gastrointestinal: Negative for abdominal pain, blood in stool, constipation, diarrhea and vomiting.  Endocrine: Negative for cold intolerance, heat intolerance and polydipsia.  Genitourinary: Negative for decreased urine volume, dysuria and hematuria.  Musculoskeletal: Negative  for arthralgias, back pain and gait problem.  Skin: Negative for rash.  Allergic/Immunologic: Negative for environmental allergies.  Neurological: Negative for seizures, syncope, light-headedness and headaches.  Hematological: Negative for adenopathy.  Psychiatric/Behavioral: Negative for agitation, dysphoric mood and suicidal ideas. The patient is not nervous/anxious.     Per HPI unless specifically indicated above     Objective:    BP (!) 140/92 (BP Location: Left Arm, Patient Position: Sitting, Cuff Size: Large)   Pulse 78   Temp 98.1 F (36.7 C)   Ht 5\' 9"  (1.753 m)   Wt 229 lb 12 oz (104.2 kg)   SpO2 97%   BMI 33.93 kg/m   Wt Readings from Last 3 Encounters:  05/30/16 229 lb 12 oz (104.2 kg)  04/25/16 226 lb 12 oz (102.9 kg)  03/10/16 224 lb (101.6 kg)    Physical Exam  Constitutional: He is oriented to person, place, and time. He appears well-developed and well-nourished.  HENT:  Head: Normocephalic and atraumatic.  Neck: Neck supple.  Cardiovascular: Normal rate and regular rhythm.   Pulmonary/Chest: Effort normal and breath sounds normal. He has no wheezes.  Abdominal: Soft. Bowel sounds are normal. There is no hepatosplenomegaly. There is no tenderness.  Musculoskeletal: He exhibits no edema.  Lymphadenopathy:    He has no cervical adenopathy.  Neurological: He is alert and oriented to person, place, and time.  Skin: Skin is warm and dry.  Psychiatric: He has a normal mood and affect. His behavior is normal.  Vitals reviewed.   Results  for orders placed or performed in visit on 05/23/16  Basic Metabolic Panel (BMET)  Result Value Ref Range   Sodium 138 135 - 146 mmol/L   Potassium 4.2 3.5 - 5.3 mmol/L   Chloride 102 98 - 110 mmol/L   CO2 26 20 - 31 mmol/L   Glucose, Bld 64 (L) 65 - 99 mg/dL   BUN 18 7 - 25 mg/dL   Creat 0.861.14 5.780.60 - 4.691.35 mg/dL   Calcium 9.2 8.6 - 62.910.3 mg/dL      Assessment & Plan:   Encounter Diagnosis  Name Primary?  . Essential  hypertension, benign Yes    -reviewed labs with pt -Counseled pt to avoid running out of his bp meds -Recheck one month ON ALL MEDS.  RTO sooner prn

## 2016-05-30 NOTE — Patient Instructions (Signed)
Avoid running out of your blood pressure medications!

## 2016-06-16 ENCOUNTER — Encounter: Payer: Self-pay | Admitting: Physician Assistant

## 2016-06-27 ENCOUNTER — Encounter: Payer: Self-pay | Admitting: Physician Assistant

## 2016-06-27 ENCOUNTER — Ambulatory Visit: Payer: Self-pay | Admitting: Physician Assistant

## 2016-06-27 VITALS — BP 144/98 | HR 72 | Temp 97.7°F | Ht 69.0 in | Wt 224.0 lb

## 2016-06-27 DIAGNOSIS — R911 Solitary pulmonary nodule: Secondary | ICD-10-CM

## 2016-06-27 DIAGNOSIS — Z6833 Body mass index (BMI) 33.0-33.9, adult: Secondary | ICD-10-CM

## 2016-06-27 DIAGNOSIS — I10 Essential (primary) hypertension: Secondary | ICD-10-CM

## 2016-06-27 DIAGNOSIS — E669 Obesity, unspecified: Secondary | ICD-10-CM

## 2016-06-27 NOTE — Progress Notes (Signed)
BP (!) 146/94 (BP Location: Left Arm, Patient Position: Sitting, Cuff Size: Normal)   Pulse 72   Temp 97.7 F (36.5 C)   Ht 5\' 9"  (1.753 m)   Wt 224 lb (101.6 kg)   SpO2 98%   BMI 33.08 kg/m    Subjective:    Patient ID: Robert Harding, male    DOB: Feb 25, 1966, 51 y.o.   MRN: [id number]  HPI: Robert Harding is a 51 y.o. male presenting on 06/27/2016 for Hypertension   HPI Pt states his BP at home was 126  Pt says he feels good  He his still smoking  Pt starts new job this week.  He Expects to have insurance in 90 days.   Relevant past medical, surgical, family and social history reviewed and updated as indicated. Interim medical history since our last visit reviewed. Allergies and medications reviewed and updated.   Current Outpatient Prescriptions:  .  carvedilol (COREG) 25 MG tablet, Take 1 tablet (25 mg total) by mouth 2 (two) times daily with a meal., Disp: 180 tablet, Rfl: 1 .  lisinopril (PRINIVIL,ZESTRIL) 20 MG tablet, Take 1 tablet (20 mg total) by mouth daily., Disp: 30 tablet, Rfl: 2   Review of Systems  Constitutional: Negative for appetite change, chills, diaphoresis, fatigue, fever and unexpected weight change.  HENT: Negative for congestion, dental problem, drooling, ear pain, facial swelling, hearing loss, mouth sores, sneezing, sore throat, trouble swallowing and voice change.   Eyes: Negative for pain, discharge, redness, itching and visual disturbance.  Respiratory: Negative for cough, choking, shortness of breath and wheezing.   Cardiovascular: Negative for chest pain, palpitations and leg swelling.  Gastrointestinal: Positive for diarrhea. Negative for abdominal pain, blood in stool, constipation and vomiting.  Endocrine: Negative for cold intolerance, heat intolerance and polydipsia.  Genitourinary: Negative for decreased urine volume, dysuria and hematuria.  Musculoskeletal: Negative for arthralgias, back pain and gait problem.  Skin: Negative for  rash.  Allergic/Immunologic: Negative for environmental allergies.  Neurological: Negative for seizures, syncope, light-headedness and headaches.  Hematological: Negative for adenopathy.  Psychiatric/Behavioral: Negative for agitation, dysphoric mood and suicidal ideas. The patient is not nervous/anxious.     Per HPI unless specifically indicated above     Objective:    BP (!) 146/94 (BP Location: Left Arm, Patient Position: Sitting, Cuff Size: Normal)   Pulse 72   Temp 97.7 F (36.5 C)   Ht 5\' 9"  (1.753 m)   Wt 224 lb (101.6 kg)   SpO2 98%   BMI 33.08 kg/m   Wt Readings from Last 3 Encounters:  06/27/16 224 lb (101.6 kg)  05/30/16 229 lb 12 oz (104.2 kg)  04/25/16 226 lb 12 oz (102.9 kg)    Physical Exam  Constitutional: He is oriented to person, place, and time. He appears well-developed and well-nourished.  HENT:  Head: Normocephalic and atraumatic.  Neck: Neck supple.  Cardiovascular: Normal rate and regular rhythm.   Pulmonary/Chest: Effort normal and breath sounds normal. He has no wheezes.  Abdominal: Soft. Bowel sounds are normal. There is no hepatosplenomegaly. There is no tenderness.  Musculoskeletal: He exhibits no edema.  Lymphadenopathy:    He has no cervical adenopathy.  Neurological: He is alert and oriented to person, place, and time.  Skin: Skin is warm and dry.  Psychiatric: He has a normal mood and affect. His behavior is normal.  Vitals reviewed.   Results for orders placed or performed in visit on 05/23/16  Basic Metabolic Panel (BMET)  Result  Value Ref Range   Sodium 138 135 - 146 mmol/L   Potassium 4.2 3.5 - 5.3 mmol/L   Chloride 102 98 - 110 mmol/L   CO2 26 20 - 31 mmol/L   Glucose, Bld 64 (L) 65 - 99 mg/dL   BUN 18 7 - 25 mg/dL   Creat 1.611.14 0.960.60 - 0.451.35 mg/dL   Calcium 9.2 8.6 - 40.910.3 mg/dL      Assessment & Plan:   Encounter Diagnoses  Name Primary?  . Essential hypertension, benign Yes  . Class 1 obesity with body mass index (BMI)  of 33.0 to 33.9 in adult, unspecified obesity type, unspecified whether serious comorbidity present   . Solitary pulmonary nodule     -reviewed labs with pt -Increase lisinopril to bid- pt states he got medassist about 1 month ago -follow up one month to recheck the BP -pt will need chest CT July 2018 to re-evaluate lung nodule

## 2016-06-27 NOTE — Patient Instructions (Signed)
Increase your lisinopril to twice daily

## 2016-07-11 ENCOUNTER — Emergency Department (HOSPITAL_COMMUNITY)
Admission: EM | Admit: 2016-07-11 | Discharge: 2016-07-11 | Disposition: A | Discharge status: Home / Self Care | Payer: Self-pay | Attending: Dermatology | Admitting: Dermatology

## 2016-07-11 DIAGNOSIS — R21 Rash and other nonspecific skin eruption: Secondary | ICD-10-CM | POA: Insufficient documentation

## 2016-07-11 DIAGNOSIS — Z87891 Personal history of nicotine dependence: Secondary | ICD-10-CM | POA: Insufficient documentation

## 2016-07-11 DIAGNOSIS — Z5321 Procedure and treatment not carried out due to patient leaving prior to being seen by health care provider: Secondary | ICD-10-CM | POA: Insufficient documentation

## 2016-07-11 NOTE — ED Triage Notes (Signed)
No answer x 1 for triage

## 2016-07-22 ENCOUNTER — Encounter (HOSPITAL_COMMUNITY): Payer: Self-pay

## 2016-07-22 ENCOUNTER — Emergency Department (HOSPITAL_COMMUNITY)
Admission: EM | Admit: 2016-07-22 | Discharge: 2016-07-22 | Disposition: A | Discharge status: Home / Self Care | Payer: Self-pay | Attending: Emergency Medicine | Admitting: Emergency Medicine

## 2016-07-22 DIAGNOSIS — B356 Tinea cruris: Secondary | ICD-10-CM | POA: Insufficient documentation

## 2016-07-22 DIAGNOSIS — Z87891 Personal history of nicotine dependence: Secondary | ICD-10-CM | POA: Insufficient documentation

## 2016-07-22 DIAGNOSIS — Z79899 Other long term (current) drug therapy: Secondary | ICD-10-CM | POA: Insufficient documentation

## 2016-07-22 DIAGNOSIS — I10 Essential (primary) hypertension: Secondary | ICD-10-CM | POA: Insufficient documentation

## 2016-07-22 LAB — CBG MONITORING, ED: GLUCOSE-CAPILLARY: 82 mg/dL (ref 65–99)

## 2016-07-22 MED ORDER — CLOTRIMAZOLE 1 % EX CREA: TOPICAL_CREAM | CUTANEOUS | 0 refills | Status: DC

## 2016-07-22 NOTE — ED Triage Notes (Signed)
Pt reports rash in his groin for over a month, states he has tried several otc meds without relief.  Itching an redness, no pain
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
## 2016-07-27 ENCOUNTER — Ambulatory Visit: Payer: Self-pay | Admitting: Physician Assistant

## 2016-07-28 ENCOUNTER — Other Ambulatory Visit: Payer: Self-pay | Admitting: Physician Assistant

## 2016-07-28 MED ORDER — CARVEDILOL 25 MG PO TABS: 25.0000 mg | ORAL_TABLET | Freq: Two times a day (BID) | ORAL | 0 refills | Status: DC

## 2016-08-02 ENCOUNTER — Ambulatory Visit: Payer: Self-pay | Admitting: Physician Assistant

## 2016-08-02 ENCOUNTER — Encounter: Payer: Self-pay | Admitting: Physician Assistant

## 2016-08-02 VITALS — BP 130/80 | HR 78 | Temp 97.7°F | Ht 69.0 in | Wt 222.0 lb

## 2016-08-02 DIAGNOSIS — I10 Essential (primary) hypertension: Secondary | ICD-10-CM

## 2016-08-02 MED ORDER — LISINOPRIL 20 MG PO TABS: 20.0000 mg | ORAL_TABLET | Freq: Two times a day (BID) | ORAL | 0 refills | Status: DC

## 2016-08-02 MED ORDER — CARVEDILOL 25 MG PO TABS: 25.0000 mg | ORAL_TABLET | Freq: Two times a day (BID) | ORAL | 0 refills | Status: AC

## 2016-08-02 MED ORDER — LISINOPRIL 20 MG PO TABS: 20.0000 mg | ORAL_TABLET | Freq: Two times a day (BID) | ORAL | 0 refills | Status: AC

## 2016-08-02 NOTE — Progress Notes (Signed)
BP 130/80 (BP Location: Left Arm, Patient Position: Sitting, Cuff Size: Normal)   Pulse 78   Temp 97.7 F (36.5 C)   Ht  (1.753 m)   Wt 222 lb (100.7 kg)   SpO2 98%   BMI 32.78 kg/m    Subjective:    Patient ID: Robert Harding, male    DOB: 09-27-1965, 51 y.o.   MRN: [id number]  HPI: [name] is a 51 y.o. male presenting on 08/02/2016 for Hypertension   HPI   Pt feeling well.    Pt  Still expecting to get insurance this summer  Pt states some trouble sleeping.  He does snore.   Discussed sleep apnea testing which pt says he prefers to wait for until he has insurance.  Pt taking 2 coreg bid instead of increasing the lisinopril as discussed at last OV  Relevant past medical, surgical, family and social history reviewed and updated as indicated. Interim medical history since our last visit reviewed. Allergies and medications reviewed and updated.   Current Outpatient Prescriptions:  .  carvedilol (COREG) 25 MG tablet, Take 1 tablet (25 mg total) by mouth 2 (two) times daily with a meal. (Patient taking differently: Take 50 mg by mouth 2 (two) times daily with a meal. ), Disp: 60 tablet, Rfl: 0 .  lisinopril (PRINIVIL,ZESTRIL) 20 MG tablet, Take 1 tablet (20 mg total) by mouth daily., Disp: 30 tablet, Rfl: 2  Review of Systems  Constitutional: Negative for appetite change, chills, diaphoresis, fatigue, fever and unexpected weight change.  HENT: Negative for congestion, dental problem, drooling, ear pain, facial swelling, hearing loss, mouth sores, sneezing, sore throat, trouble swallowing and voice change.   Eyes: Negative for pain, discharge, redness, itching and visual disturbance.  Respiratory: Negative for cough, choking, shortness of breath and wheezing.   Cardiovascular: Negative for chest pain, palpitations and leg swelling.  Gastrointestinal: Negative for abdominal pain, blood in stool, constipation, diarrhea and vomiting.  Endocrine: Negative for cold  intolerance, heat intolerance and polydipsia.  Genitourinary: Negative for decreased urine volume, dysuria and hematuria.  Musculoskeletal: Negative for arthralgias, back pain and gait problem.  Skin: Negative for rash.  Allergic/Immunologic: Negative for environmental allergies.  Neurological: Negative for seizures, syncope, light-headedness and headaches.  Hematological: Negative for adenopathy.  Psychiatric/Behavioral: Negative for agitation, dysphoric mood and suicidal ideas. The patient is not nervous/anxious.     Per HPI unless specifically indicated above     Objective:    BP 130/80 (BP Location: Left Arm, Patient Position: Sitting, Cuff Size: Normal)   Pulse 78   Temp 97.7 F (36.5 C)   Ht  (1.753 m)   Wt 222 lb (100.7 kg)   SpO2 98%   BMI 32.78 kg/m   Wt Readings from Last 3 Encounters:  08/02/16 222 lb (100.7 kg)  07/22/16 220 lb (99.8 kg)  06/27/16 224 lb (101.6 kg)    Physical Exam  Constitutional: He is oriented to person, place, and time. He appears well-developed and well-nourished.  HENT:  Head: Normocephalic and atraumatic.  Neck: Neck supple.  Cardiovascular: Normal rate and regular rhythm.   Pulmonary/Chest: Effort normal and breath sounds normal. He has no wheezes.  Abdominal: Soft. Bowel sounds are normal. There is no hepatosplenomegaly. There is no tenderness.  Musculoskeletal: He exhibits no edema.  Lymphadenopathy:    He has no cervical adenopathy.  Neurological: He is alert and oriented to person, place, and time.  Skin: Skin is warm and dry.  Psychiatric: He  has a normal mood and affect. His behavior is normal.  Vitals reviewed.        Assessment & Plan:   Encounter Diagnosis  Name Primary?  . Essential hypertension, benign Yes      -discussed with pt taking coreg  bid and lisinopril  bid as discussed at last OV -follow up one month to recheck the BP -reminded pt that he will need repeat CXR end of summer to recheck  lung nodule -pt to RTO sooner prn

## 2016-08-15 ENCOUNTER — Other Ambulatory Visit: Payer: Self-pay | Admitting: Physician Assistant

## 2016-08-30 ENCOUNTER — Ambulatory Visit: Payer: Self-pay | Admitting: Physician Assistant

## 2016-09-28 ENCOUNTER — Encounter: Payer: Self-pay | Admitting: Physician Assistant

## 2017-06-09 IMAGING — CT CT ABD-PELV W/ CM
2 of 5 series · 15 of 46 positions shown, 17 images · IV contrast (agent unspecified)
Comparison: 10/31/2004

CLINICAL DATA: Right lower quadrant pain. Right hip pain for 1
week.

EXAM:
CT ABDOMEN AND PELVIS WITH CONTRAST
TECHNIQUE: Multidetector CT imaging of the abdomen and pelvis was performed
using the standard protocol following bolus administration of
intravenous contrast.
CONTRAST:  100 cc [id number] intravenous

[Series 2: routine abd pel with · axial · 0.76mm/px · z∈[-271,+234]mm · 12 of 115 slices shown, 14 images]
[im 7/115  soft-tissue]
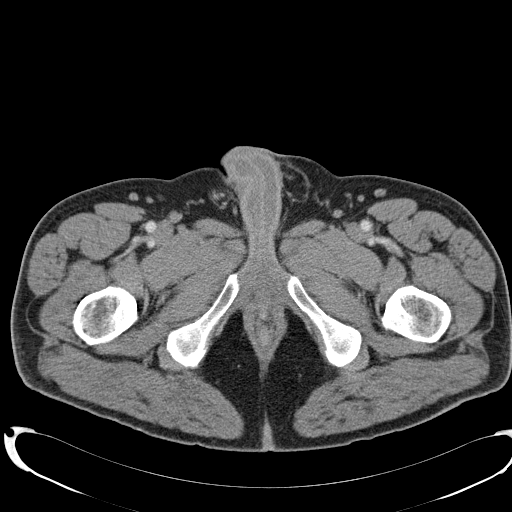
[im 7/115  bone]
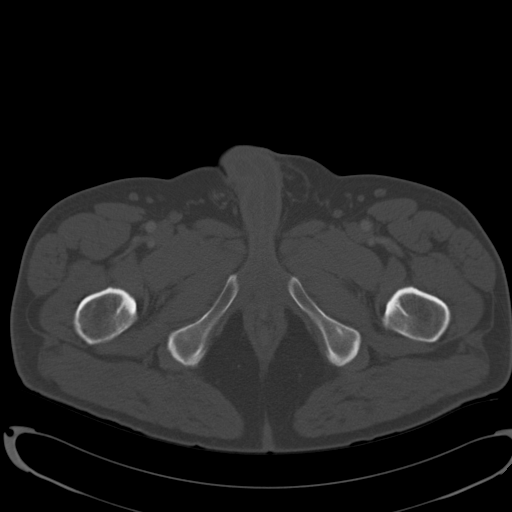
[im 20/115  soft-tissue]
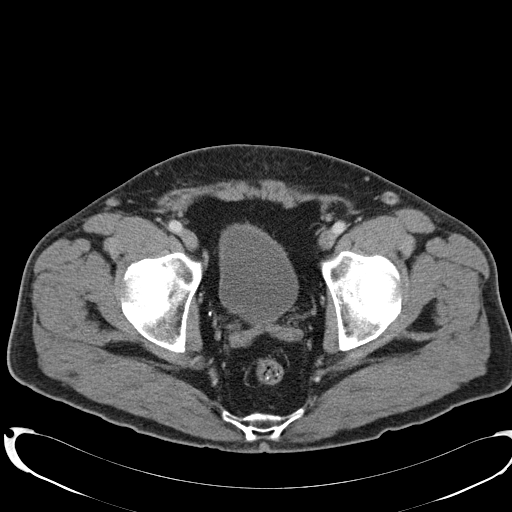
[im 26/115  soft-tissue]
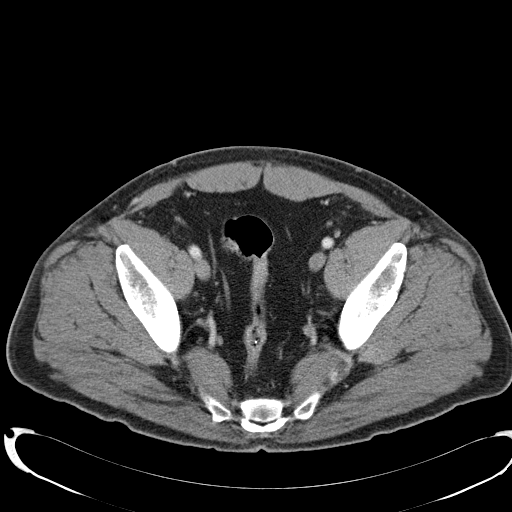
[im 32/115  soft-tissue]
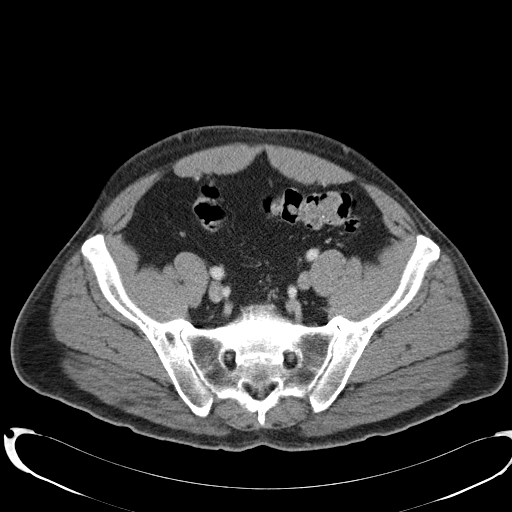
[im 45/115  soft-tissue]
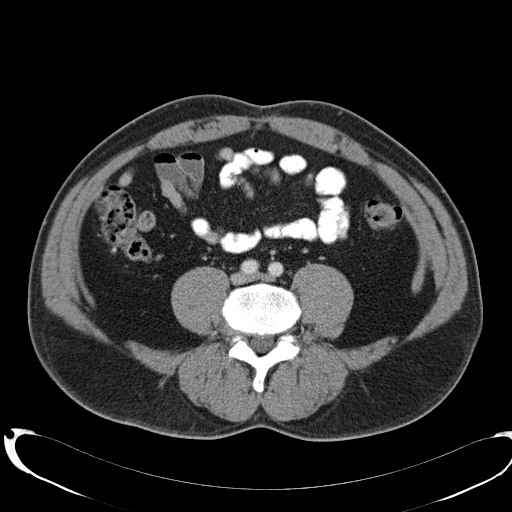
[im 51/115  soft-tissue]
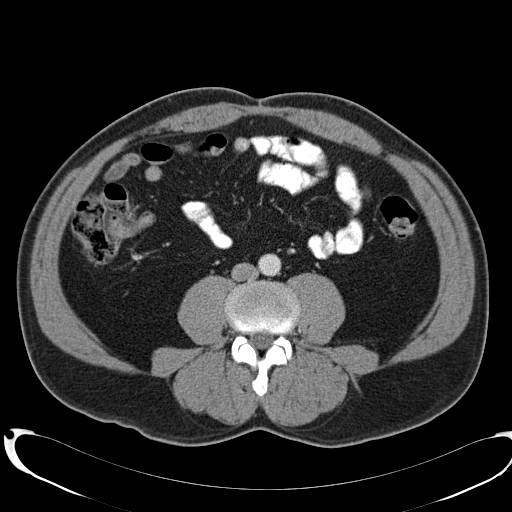
[im 64/115  soft-tissue]
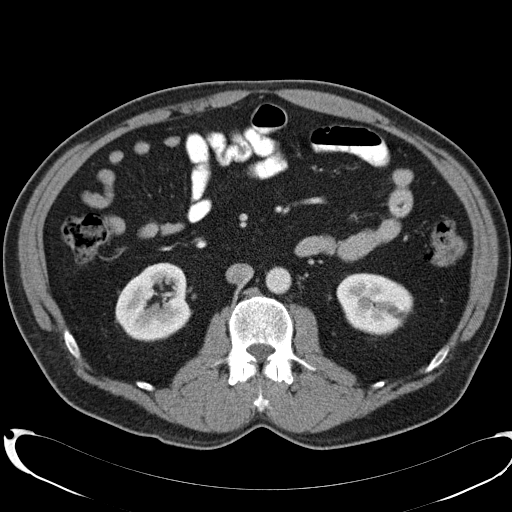
[im 70/115  soft-tissue]
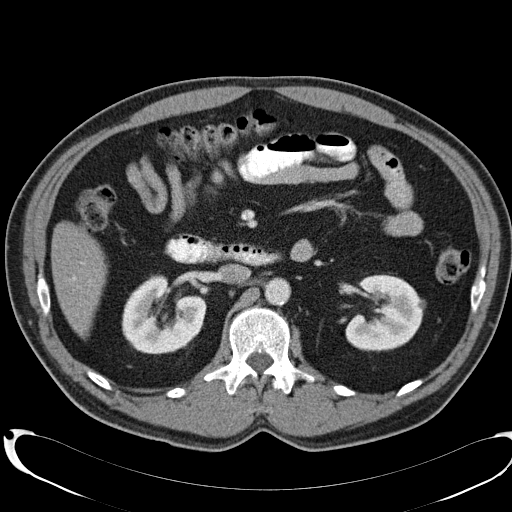
[im 83/115  soft-tissue]
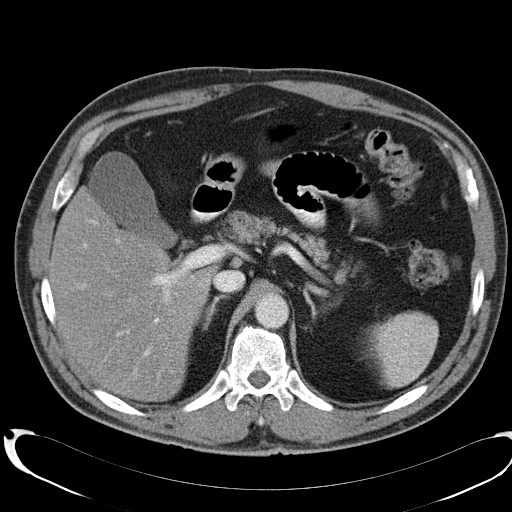
[im 83/115  bone]
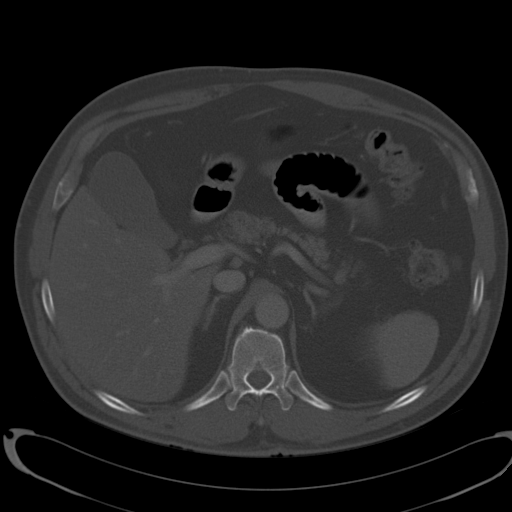
[im 89/115  soft-tissue]
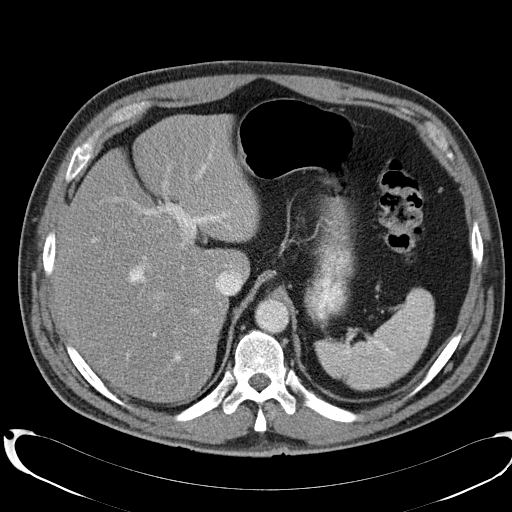
[im 96/115  soft-tissue]
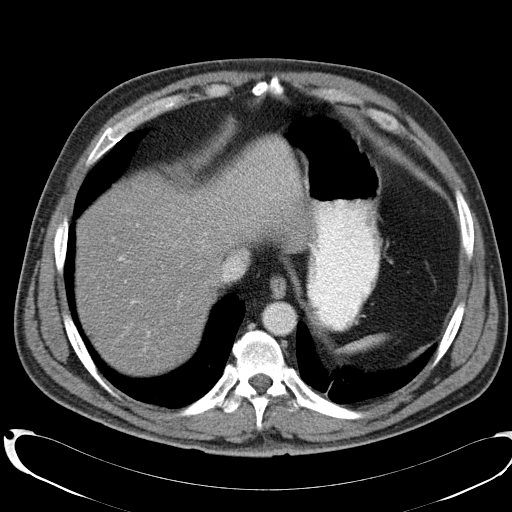
[im 108/115  soft-tissue]
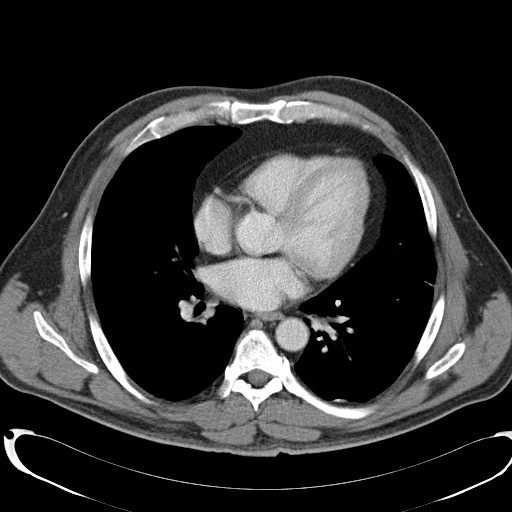

[Series 3: coronal · coronal · 0.81mm/px · 3 of 158 slices shown]
[im 53/158  soft-tissue]
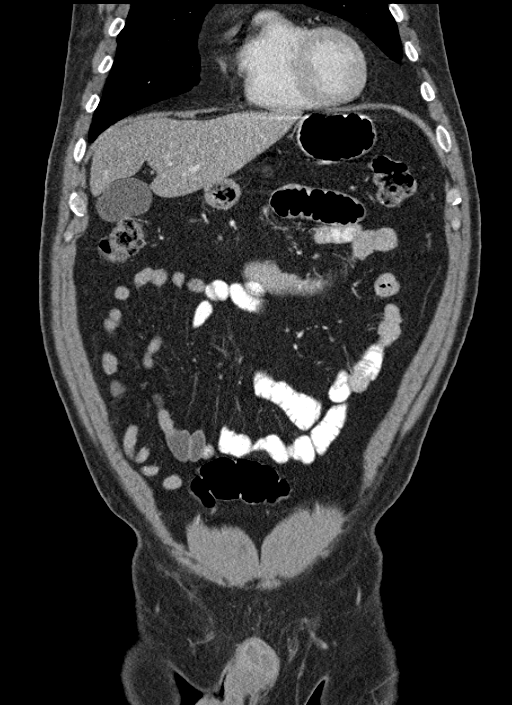
[im 70/158  soft-tissue]
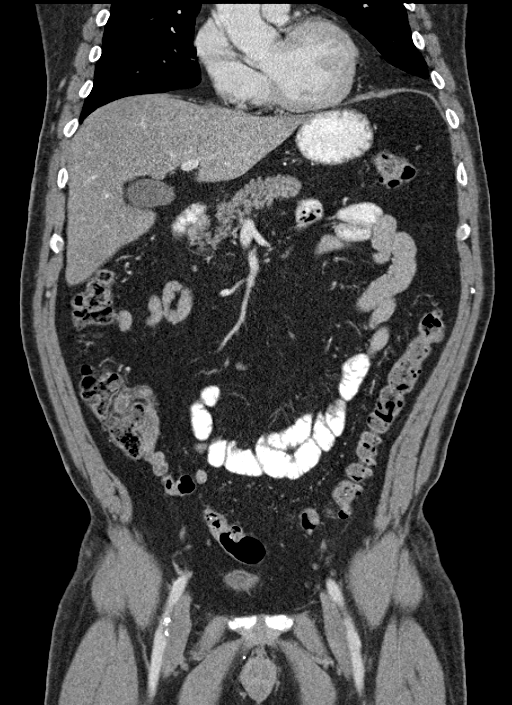
[im 88/158  soft-tissue]
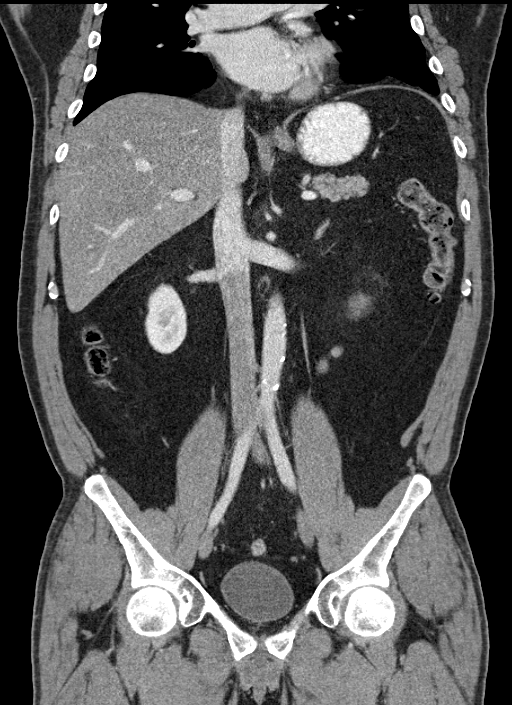

[15 of 46 positions shown; findings below may reference images not displayed]

FINDINGS: Lower chest and abdominal wall: Fatty enlargement of the left
inguinal canal, similar to 0446

5 mm right upper lobe pulmonary nodule, [DATE]. Mild scarring over the
left diaphragm.

Hepatobiliary: Hepatic steatosis. Presumed 1 cm subcapsular cyst in
segment 5.No evidence of biliary obstruction or stone.

Pancreas: Unremarkable.

Spleen: Unremarkable.

Adrenals/Urinary Tract: Negative adrenals. No hydronephrosis or
stone. Bilateral renal cysts measuring up to 26 mm exophytic from
the upper pole right kidney. Unremarkable bladder.

Stomach/Bowel: No obstruction. Distal colonic diverticulosis. No
active inflammation. No appendicitis.

Reproductive:Symmetric prominence of the prostate with heterogeneous
enhancement.

Vascular/Lymphatic: Abdominal aortic atherosclerotic calcification.
No acute vascular abnormality. No mass or adenopathy.

Other: No ascites or pneumoperitoneum.

Musculoskeletal: No acute abnormalities. Degenerative sacroiliac
ankylosis.
IMPRESSION: 1. No acute finding, including appendicitis.
2. 5 mm right-sided lung nodule. Non-contrast chest CT can be
considered in 12 months for this smoker. This recommendation follows
the consensus statement: Guidelines for Management of Incidental
Pulmonary Nodules Detected on CT Images:From the [HOSPITAL]
9596; published online before print (10.1148/[id number]).
3.  Aortic Atherosclerosis (94Y7N-170.0)
4. Colonic diverticulosis, hepatic steatosis, and other chronic
findings are described above.

## 2018-03-11 DEATH — deceased
